# Patient Record
Sex: Female | Born: 1945
Health system: Southern US, Community
[De-identification: ages and names within clinical notes are randomized; demographics above are authoritative.]

## PROBLEM LIST (undated history)

## (undated) DIAGNOSIS — I10 Essential (primary) hypertension: Secondary | ICD-10-CM

## (undated) DIAGNOSIS — K2270 Barrett's esophagus without dysplasia: Secondary | ICD-10-CM

## (undated) DIAGNOSIS — K219 Gastro-esophageal reflux disease without esophagitis: Secondary | ICD-10-CM

## (undated) DIAGNOSIS — Z860101 Personal history of adenomatous and serrated colon polyps: Secondary | ICD-10-CM

## (undated) DIAGNOSIS — Z86010 Personal history of colonic polyps: Secondary | ICD-10-CM

## (undated) HISTORY — DX: Personal history of colonic polyps: Z86.010

## (undated) HISTORY — DX: Barrett's esophagus without dysplasia: K22.70

## (undated) HISTORY — PX: UPPER GASTROINTESTINAL ENDOSCOPY: SHX188

## (undated) HISTORY — PX: BACK SURGERY: SHX140

## (undated) HISTORY — DX: Personal history of adenomatous and serrated colon polyps: Z86.0101

## (undated) HISTORY — PX: COLONOSCOPY: SHX174

## (undated) HISTORY — PX: BUNIONECTOMY: SHX129

## (undated) HISTORY — DX: Essential (primary) hypertension: I10

---

## 1999-04-28 ENCOUNTER — Other Ambulatory Visit: Admission: RE | Admit: 1999-04-28 | Discharge: 1999-04-28 | Payer: Self-pay | Admitting: Obstetrics and Gynecology

## 1999-09-03 ENCOUNTER — Ambulatory Visit (HOSPITAL_COMMUNITY): Admission: RE | Admit: 1999-09-03 | Discharge: 1999-09-03 | Payer: Self-pay | Admitting: Gastroenterology

## 2000-05-10 ENCOUNTER — Other Ambulatory Visit: Admission: RE | Admit: 2000-05-10 | Discharge: 2000-05-10 | Payer: Self-pay | Admitting: Obstetrics and Gynecology

## 2000-10-25 ENCOUNTER — Encounter (INDEPENDENT_AMBULATORY_CARE_PROVIDER_SITE_OTHER): Payer: Self-pay

## 2000-10-25 ENCOUNTER — Ambulatory Visit (HOSPITAL_COMMUNITY): Admission: RE | Admit: 2000-10-25 | Discharge: 2000-10-25 | Payer: Self-pay | Admitting: Gastroenterology

## 2001-08-19 ENCOUNTER — Other Ambulatory Visit: Admission: RE | Admit: 2001-08-19 | Discharge: 2001-08-19 | Payer: Self-pay | Admitting: Obstetrics and Gynecology

## 2002-11-29 ENCOUNTER — Ambulatory Visit (HOSPITAL_COMMUNITY): Admission: RE | Admit: 2002-11-29 | Discharge: 2002-11-29 | Payer: Self-pay | Admitting: Gastroenterology

## 2002-11-29 ENCOUNTER — Encounter (INDEPENDENT_AMBULATORY_CARE_PROVIDER_SITE_OTHER): Payer: Self-pay

## 2002-12-08 ENCOUNTER — Other Ambulatory Visit: Admission: RE | Admit: 2002-12-08 | Discharge: 2002-12-08 | Payer: Self-pay | Admitting: Obstetrics and Gynecology

## 2004-09-10 ENCOUNTER — Other Ambulatory Visit: Admission: RE | Admit: 2004-09-10 | Discharge: 2004-09-10 | Payer: Self-pay | Admitting: Obstetrics and Gynecology

## 2004-09-25 ENCOUNTER — Ambulatory Visit (HOSPITAL_COMMUNITY): Admission: RE | Admit: 2004-09-25 | Discharge: 2004-09-25 | Payer: Self-pay | Admitting: Gastroenterology

## 2004-09-25 ENCOUNTER — Encounter (INDEPENDENT_AMBULATORY_CARE_PROVIDER_SITE_OTHER): Payer: Self-pay | Admitting: *Deleted

## 2005-10-06 ENCOUNTER — Other Ambulatory Visit: Admission: RE | Admit: 2005-10-06 | Discharge: 2005-10-06 | Payer: Self-pay | Admitting: Obstetrics and Gynecology

## 2008-03-01 ENCOUNTER — Other Ambulatory Visit: Admission: RE | Admit: 2008-03-01 | Discharge: 2008-03-01 | Payer: Self-pay | Admitting: Obstetrics and Gynecology

## 2008-06-20 ENCOUNTER — Observation Stay (HOSPITAL_COMMUNITY): Admission: AD | Admit: 2008-06-20 | Discharge: 2008-06-22 | Payer: Self-pay | Admitting: Specialist

## 2009-11-04 ENCOUNTER — Ambulatory Visit: Payer: Self-pay | Admitting: Internal Medicine

## 2009-11-04 ENCOUNTER — Inpatient Hospital Stay (HOSPITAL_COMMUNITY): Admission: EM | Admit: 2009-11-04 | Discharge: 2009-11-07 | Payer: Self-pay | Admitting: Emergency Medicine

## 2009-12-27 ENCOUNTER — Encounter: Admission: RE | Admit: 2009-12-27 | Discharge: 2009-12-27 | Payer: Self-pay | Admitting: Orthopedic Surgery

## 2010-07-27 ENCOUNTER — Encounter: Payer: Self-pay | Admitting: Gastroenterology

## 2010-09-23 LAB — BASIC METABOLIC PANEL
BUN: 12 mg/dL (ref 6–23)
CO2: 28 mEq/L (ref 19–32)
Calcium: 8 mg/dL — ABNORMAL LOW (ref 8.4–10.5)
Chloride: 105 mEq/L (ref 96–112)
Creatinine, Ser: 0.63 mg/dL (ref 0.4–1.2)
GFR calc Af Amer: >60 mL/min (ref >60)
GFR calc non Af Amer: >60 mL/min (ref >60)
Glucose, Bld: 113 mg/dL — ABNORMAL HIGH (ref 70–99)
Potassium: 3.6 mEq/L (ref 3.5–5.1)
Sodium: 138 mEq/L (ref 135–145)

## 2010-09-23 LAB — CBC
HCT: 30.7 % — ABNORMAL LOW (ref 36.0–46.0)
Hemoglobin: 10.3 g/dL — ABNORMAL LOW (ref 12.0–15.0)
MCHC: 33.6 g/dL (ref 30.0–36.0)
MCV: 88.2 fL (ref 78.0–100.0)
Platelets: 148 10*3/uL — ABNORMAL LOW (ref 150–400)
RBC: 3.48 MIL/uL — ABNORMAL LOW (ref 3.87–5.11)
RDW: 14.7 % (ref 11.5–15.5)
WBC: 5.8 10*3/uL (ref 4.0–10.5)

## 2010-11-18 NOTE — Op Note (Signed)
Sheila Rice, Sheila Rice               ACCOUNT NO.:  [id number]   MEDICAL RECORD NO.:  [id number]          PATIENT TYPE:  AMB   LOCATION:  DAY                          FACILITY:  [facility]   PHYSICIAN:  Jene Every, M.D.    DATE OF BIRTH:  03-Jul-1946   DATE OF PROCEDURE:  06/20/2008  DATE OF DISCHARGE:                               OPERATIVE REPORT   PREOPERATIVE DIAGNOSIS:  Spinal stenosis, spondylolisthesis at L4-5.   POSTOPERATIVE DIAGNOSIS:  Spinal stenosis, spondylolisthesis at L4-5.   PROCEDURE PERFORMED:  1. Central laminectomy of L4.  2. Lateral mass and facet fusion utilizing autologous allograft bone      graft.  3. Posterior instrumentation 4-5 facets.   SURGEON:  Jene Every, M.D.   ASSISTANT:  Alvy Beal, MD and Roma Schanz, P.A.   ESTIMATED BLOOD LOSS:  150 mL.   INDICATIONS FOR PROCEDURE:  A 65 year old with neurogenic claudication  secondary to spinal stenosis particularly left with neural foraminal  stenosis.  She had also associated back pain and disk degeneration at 5-  1.  She was indicated for decompression at 4-5, lateral mass possible  facet fusion with instrumentation.  She had fairly small disk space at 4-  5 with no instability of flexion extension.  Therefore felt that  interbody was not necessary.  Risks and benefits discussed, including  bleeding, infection, damage to neurovascular structures, CSF leakage or  epidural fibrosis, adjacent segment disease, need for augmentation in  the future.   TECHNIQUE:  With the patient in supine position after the induction of  adequate general anesthesia and 2 grams Kefzol she was placed prone on  the [employer] Wilson frame with slight flexion noted.  Lumbar region was  prepped and draped in the usual sterile fashion.  Two 18 gauge spinal  needles were utilized to localize the 4-5 interspace at the region of  the slit.  Incision was made from the spinous process of 4 to below 5.  Subcutaneous tissue was  dissected.  Electrocautery was utilized to  achieve hemostasis.  Dorsolumbar fascia identified by the skin incision.  Paraspinous muscle elevated from lamina of 4-5.  Confirmatory radiograph  obtained with Kocher on the spinous process of 4.  The spinous process  of 4 and 5 were removed with Leksell rongeurs, morselized and saved for  autologous bone graft.  We proceeded with the decompression and  performed a hemilaminotomy caudad edge of 2 and cephalad edge of 5 with  a 2 mm Kerrison, decompressing the central canal.  We then decompressed  the lateral recesses at the medial border of the pedicle utilizing a 2  mm Kerrison performing foraminotomies of L5 and L4 preserving the neural  elements at all times particularly over the stenosis and adherence was  notable on the left in the L4 foramen.  This was undercut, ligamentum  flavum removed and the soft tissue mobilized.  Following decompression  there was satisfactory mobility of the 4 and 5 roots and the hockey  stick probe was placed out the foramen of 4 and 5 and found to be widely  patent.  Bipolar electrocautery was utilized to achieve hemostasis as  was bone wax and thrombin soaked Gelfoam.   Next we skeletonized the facets at 4-5 bilaterally removing the  remaining cartilage and with a small dental bur and a small micro  curette performing good bleeding surfaces within the facets and  posteriorly as well as in the T-piece.  Next using the C-arm  augmentation we then placed two facet screws.  Bone graft was placed in  the facets first.  We reduced the flexion on the foramen.  We drilled a  drill hole into the facet bilaterally with slight divergence and using  guidance on the C-arm.  Both were in the vertebral body by probing.  We  used 4.5 screws bilaterally and inserted them with excellent compression  of both facets with expression of hematoma.  There was no fracture of  the pars and the pars was preserved.  This was seen in the  AP and  lateral plane.  We used a hockey stick probe out the foramen of 4 and 5  and felt the pedicle bilaterally at 5 and at 4 there was no breaching of  the pedicle noted.  No CSF leakage either.  The wound was copiously  irrigated.  We then augmented the lateral mass fusion with Actifuse bone  graft and her cancellous bone graft.  Prior to that we placed thrombin  soaked Gelfoam in the laminotomy defect to prevent migration of the bony  fragments.  The AP and lateral planes were found to be satisfactory.   Next McCullough retractors removed.  Paraspinous muscles inspected and  no evidence of active bleeding.  Dorsolumbar fascia reapproximated with  #1 Vicryl interrupted figure-of-eight sutures.  Subcutaneous tissue  reapproximated with 2-0 Vicryl simple sutures and skin reapproximated  with staples.  The wound was dressed sterilely.  She was placed supine  on the hospital bed and extubated without difficulty and transported to  recovery in satisfactory condition.  The patient tolerated the procedure  without complications.      Jene Every, M.D.  Electronically Signed     JB/MEDQ  D:  06/20/2008  T:  06/21/2008  Job:  [id number]

## 2010-11-21 NOTE — Op Note (Signed)
Sheila Rice, Sheila Rice               ACCOUNT NO.:  [id number]   MEDICAL RECORD NO.:  [id number]          PATIENT TYPE:  AMB   LOCATION:  ENDO                         FACILITY:  [facility]   PHYSICIAN:  John C. Madilyn Fireman, M.D.    DATE OF BIRTH:  1945-07-19   DATE OF PROCEDURE:  09/25/2004  DATE OF DISCHARGE:                                 OPERATIVE REPORT   PROCEDURE:  Colonoscopy.   INDICATION FOR PROCEDURE:  Lower abdominal pain in a patient undergoing  Barrett's surveillance today who has had no colon screening within the last  5 years.   DESCRIPTION OF PROCEDURE:  The patient was placed in the left lateral  decubitus position and placed on the pulse monitor with continuous low-flow  oxygen delivered by nasal cannula.  She was sedated with 25 mcg IV fentanyl  and 2 mg IV Versed in addition to the medicine given for the previous EGD.  The Olympus video colonoscope was inserted into the rectum and advanced to  the cecum, confirmed by transillumination at McBurney's point and  visualization at the ileocecal valve and appendiceal orifice.  The prep was  excellent.  The cecum, ascending, transverse, descending, and sigmoid colon  all appeared normal with no masses, polyps, diverticula, or other mucosal  abnormalities.  The rectum likewise appeared normal, and retroflexed view of  the anus revealed no obvious internal hemorrhoids.  The scope was then  withdrawn, and the patient returned to the recovery room in stable  condition.  She tolerated the procedure well, and there were no immediate  complications.   IMPRESSION:  Normal study.   PLAN:  Consider gynecologic work-up for lower abdominal pain and will also  consider course of antispasmodic.      JCH/MEDQ  D:  09/25/2004  T:  09/25/2004  Job:  [id number]   cc:   Caryn Bee L. Little, M.D.  [address]  [place]  [place] [address]  Fax: [phone]

## 2010-11-21 NOTE — Op Note (Signed)
   NAME:  Sheila Rice, Sheila Rice                         ACCOUNT NO.:  [id number]   MEDICAL RECORD NO.:  [id number]                   PATIENT TYPE:  AMB   LOCATION:  ENDO                                 FACILITY:  [facility]   PHYSICIAN:  John C. Madilyn Fireman, M.D.                 DATE OF BIRTH:  12/06/1945   DATE OF PROCEDURE:  11/29/2002  DATE OF DISCHARGE:                                 OPERATIVE REPORT   PROCEDURE:  Esophagogastroduodenoscopy with biopsy.   INDICATIONS:  Barrett's esophagus on EGD about two years ago.   DESCRIPTION OF PROCEDURE:  The patient was placed in the left lateral  decubitus position and placed on the pulse monitor with continuous low flow  oxygen delivered by nasal cannula.  She was sedated with 50 mcg IV fentanyl  and 6 mg IV Versed.  The Olympus video endoscope was advanced under direct  vision into the oropharynx, esophagus and stomach.  The esophagus was  straight and of normal caliber, the squamocolumnar line somewhat irregular  at 30-29 cm from the incisors. The lower esophageal sphincter appeared to be  at approximately 33 cm indicative of 3 cm segment of Barrett's esophagus.  There was a 3 cm hiatal hernia distal to the lower esophageal sphincter with  the apparent hiatus evident at 36 cm.  There was free reflux noted during  the procedure but no visible esophagitis and no ring or stricture.  There  was no visible suspicion of mucosal neoplasm.  The stomach was entered and a  small amount of liquid secretions were suctioned from the fundus.  Retroflexed view of the cardia confirmed the hiatal hernia and was otherwise  unremarkable.  The fundus, body, antrum, and pylorus all appeared normal.  The duodenum was entered and both bulb and second portion were well  inspected and appeared to be within normal limits.  The scope was then  withdrawn back into the esophagus and biopsies were taken from the Barrett's  segment.  The scope was then withdrawn and the patient  returned to the  recovery room in stable condition.  She tolerated the procedure well and  there were no immediate complications.   IMPRESSION:  Short segment Barrett's esophagus with hiatal hernia.   PLAN:  Will await biopsy results to determine the interval for the next  endoscopy.                                               John C. Madilyn Fireman, M.D.    JCH/MEDQ  D:  11/29/2002  T:  11/29/2002  Job:  [id number]   cc:   Caryn Bee L. Little, M.D.  [address]  [place]  [place] [address]  Fax: [phone]

## 2010-11-21 NOTE — Procedures (Signed)
[facility]  Patient:    Sheila Rice, Sheila Rice                        MRN: [id number]. Date: 10/25/00 Adm. Date:  [id number] Attending:  Louie Bun                           Procedure Report  PROCEDURE:  Esophagogastroduodenoscopy with biopsy.  INDICATIONS FOR PROCEDURE:  A history of severe esophagitis, unable to rule out Barretts esophagus by previous EGD.  DESCRIPTION OF PROCEDURE:  The patient was placed in the left lateral decubitus position and placed on the pulse monitor with continuous low-flow oxygen delivered by nasal cannula.  She was sedated with 50 mg IV Demerol and 4 mg IV Versed.  The Olympus video endoscope was advanced under direct vision into the oropharynx and esophagus.  The esophagus was slightly tortuous, but of normal caliber; with the squamocolumnar line somewhat irregular, ranging between 29 and 27 cm from the incisors.  There was a broad hiatal hernia and a patchulous gastroesophageal junction.  The true LES appeared to be at about 31 cm, indicating a Barretts segment ranging from 2-5 cm.  There were no erosions, exudate or ulcers; and no visible suspicion of neoplasm.  There was a 5 cm hiatal hernia distal to, what appeared to be, the true LES.  No inflammatory changes were noted within the hernia sac.  The stomach was entered and a small amount of liquid secretions were suctioned from the fundus.  Retroflexed view of the cardia confirmed the large hiatal hernia, and was otherwise unremarkable.  The fundus body, antrum and pylorus all appeared normal.  The duodenum was entered and both the bulb and second portion were well inspected; appeared to be within normal limits.  The scope was withdrawn back into the distal esophagus and five biopsies of the Barretts segment were obtained.  The scope was then withdrawn, and the patient returned to the recovery room in stable condition.  She tolerated the procedure well and there  were no immediate complications.  IMPRESSION: 1. Short segment of Barretts esophagus. 2. Proximal and anteromedial hiatal hernia.  PLAN:  Await histology, and we will probably repeat EGD in two to three years. DD:  10/25/00 TD:  10/25/00 Job: [id number]

## 2010-11-21 NOTE — Op Note (Signed)
Sheila Rice, Sheila Rice               ACCOUNT NO.:  [id number]   MEDICAL RECORD NO.:  [id number]          PATIENT TYPE:  AMB   LOCATION:  ENDO                         FACILITY:  [facility]   PHYSICIAN:  John C. Madilyn Fireman, M.D.    DATE OF BIRTH:  [birth year]   DATE OF PROCEDURE:  09/25/2004  DATE OF DISCHARGE:                                 OPERATIVE REPORT   INDICATION FOR PROCEDURE:  History of Barrett's esophagus, due for  surveillance.   PROCEDURE:  The patient was placed in the left lateral decubitus position  and placed on pulse monitor with continuous low-flow oxygen delivered by  nasal cannula. She was sedated with 75 mcg IV fentanyl and 6 mg IV Versed.  Olympus video endoscope was advanced under direct vision into the oropharynx  and esophagus.  The esophagus was straight and of normal caliber, with the  squamocolumnar line at 31 cm. The GE junction appeared to be at 34 cm above  a 3-cm hiatal hernia. Biopsies were taken of the Barrett's segment from 31-  34 cm. There were no ulcers or erosions seen within the Barrett's segment or  the hernia sac. Stomach was entered. A small amount of liquid secretions  were suctioned from the fundus. Retroflexed view of the cardia was  unremarkable. Retroflexed view of cardia confirmed a large hiatal hernia and  was otherwise unremarkable. The fundus, body, antrum, and pylorus all  appeared normal. The duodenum was entered and both bulb and second portion  were well inspected and appeared be within normal limits. Scope was then  withdrawn and the patient prepared for colonoscopy. She tolerated the  procedure well. There were no immediate complications.   IMPRESSION:  1.  Large hiatal hernia.  2.  A 3-cm Barrett's esophagus segment.   PLAN:  Await histology and will proceed with colonoscopy as scheduled.      JCH/MEDQ  D:  09/25/2004  T:  09/25/2004  Job:  [id number]   cc:   Caryn Bee L. Little, M.D.  [address]  [place]  [place]  [address]  Fax: [phone]

## 2010-11-21 NOTE — Discharge Summary (Signed)
Sheila Rice               ACCOUNT NO.:  [id number]   MEDICAL RECORD NO.:  [id number]          PATIENT TYPE:  INP   LOCATION:  1535                         FACILITY:  [facility]   PHYSICIAN:  Jene Every, M.D.    DATE OF BIRTH:  July 20, 1945   DATE OF ADMISSION:  06/20/2008  DATE OF DISCHARGE:  06/22/2008                               DISCHARGE SUMMARY   ADMISSION DIAGNOSES:  1. Spinal stenosis, facet arthropathy at L4-5.  2. Gastroesophageal reflux disease.  3. Osteoporosis.   DISCHARGE DIAGNOSES:  1. Spinal stenosis, facet arthropathy at L4-5.  2. Gastroesophageal reflux disease.  3. Osteoporosis.  4. Status post decompression lateral mass fusion with facet fixation.   HISTORY:  [name] is a pleasant 65 year old female with a history  of back and lower extremity pain.  Studies reveal fairly severe stenosis  as well as facet arthropathy.  She has undergone conservative treatment  and failed.  It is felt at this point she would benefit from a  decompression at 4-5 as well as fixation of facet.  The risks and  benefits of this were discussed with the patient and she does elect to  proceed.   PROCEDURE:  The patient was taken to the OR on December 16 and underwent  a total decompression at L4-5 with lateral mass fusion with facet  fixation.   SURGEON:  Jene Every, M.D.   ASSISTANT:  Alvy Beal, M.D.  Roma Schanz, P.A.-C.   ANESTHESIA:  General.   COMPLICATIONS:  None.   LABORATORY DATA:  Preoperative CBC shows white cell count 6.8,  hemoglobin 12.0, hematocrit 36.4.  This was repeated postoperatively  with white cell count within normal range.  Hemoglobin did drop to a  level of 9.9, hematocrit 29.2.  Coagulation studies were probably within  normal range preop and postop.  Chemistries remained stable.  Routine  liver function tests showed slightly elevated AST of 52, ALT of 40.  Preoperative urinalysis showed trace hemoglobin, only 0-2 RBCs seen  per  high-powered field.  Blood type is A positive.  A preoperative chest x-  ray showed no active disease; hiatal hernia was noted.  A preoperative  EKG showed normal sinus rhythm, possible left atrial enlargement.   CONSULTATIONS:  Physical Therapy and Occupational Therapy.   HOSPITAL COURSE:  The patient was taken to the OR and underwent the  above stated procedure.  She was then transferred to the PACU and then  to the orthopedic floor for postoperative care.  Postoperatively, the  patient did well.  She did have some low back pain, but decreased lower  extremity pain.  She was complaining of some nausea secondary to  medications.  She denied any headaches.  The dressing remained clean and  dry.  We did adjust her medications accordingly to prevent nausea.  Discharge planning was initiated.  The patient did fairly well with  therapy.  On postoperative day #2, it was felt the patient was stable to  be discharged home.  Pain was much improved.  Nausea had resolved.  She  had been out of bed to  the bathroom with minimal difficulty.  Vital  signs were stable.  Incision was clean and dry.   DISPOSITION:  The patient discharged home.  Will continue Home Health  Therapy needs.   DISCHARGE FOLLOWUP:  She is to follow up with Dr. Shelle Iron in approximately  10-14 days.  She is to change her dressing daily.  Keep this area clean  and dry.   DISCHARGE ACTIVITIES:  She is to walk as tolerated.  No dipping,  twisting or lifting.   DISCHARGE MEDICATIONS:  Include home medications as well as:  1. Vitamin C 500 mg daily.  2. Norco p.r.n. pain.  3. Robaxin p.r.n. for spasms.  4. Stool softener over the counter.   DISCHARGE DIET:  As tolerated.   CONDITION ON DISCHARGE:  Stable.   FINAL DIAGNOSIS:  Doing well status post decompression at L4-5 with  facet fixation.      Roma Schanz, P.A.      Jene Every, M.D.  Electronically Signed    CS/MEDQ  D:  07/18/2008  T:   07/18/2008  Job:  [id number]

## 2011-04-10 LAB — CBC
HCT: 29.2 % — ABNORMAL LOW (ref 36.0–46.0)
HCT: 36.4 % (ref 36.0–46.0)
Hemoglobin: 12 g/dL (ref 12.0–15.0)
Hemoglobin: 9.9 g/dL — ABNORMAL LOW (ref 12.0–15.0)
MCHC: 33 g/dL (ref 30.0–36.0)
MCHC: 33.7 g/dL (ref 30.0–36.0)
MCV: 89.7 fL (ref 78.0–100.0)
MCV: 90.6 fL (ref 78.0–100.0)
Platelets: 143 10*3/uL — ABNORMAL LOW (ref 150–400)
Platelets: 177 10*3/uL (ref 150–400)
RBC: 3.22 MIL/uL — ABNORMAL LOW (ref 3.87–5.11)
RBC: 4.06 MIL/uL (ref 3.87–5.11)
RDW: 13.9 % (ref 11.5–15.5)
RDW: 14.3 % (ref 11.5–15.5)
WBC: 6.1 10*3/uL (ref 4.0–10.5)
WBC: 6.8 10*3/uL (ref 4.0–10.5)

## 2011-04-10 LAB — URINALYSIS, ROUTINE W REFLEX MICROSCOPIC
Bilirubin Urine: NEGATIVE
Glucose, UA: NEGATIVE mg/dL
Ketones, ur: NEGATIVE mg/dL
Leukocytes, UA: NEGATIVE
Nitrite: NEGATIVE
Protein, ur: NEGATIVE mg/dL
Specific Gravity, Urine: 1.017 (ref 1.005–1.030)
Urobilinogen, UA: 0.2 mg/dL (ref 0.0–1.0)
pH: 6 (ref 5.0–8.0)

## 2011-04-10 LAB — DIFFERENTIAL
Basophils Absolute: 0 10*3/uL (ref 0.0–0.1)
Basophils Relative: 0 % (ref 0–1)
Eosinophils Absolute: 0.3 10*3/uL (ref 0.0–0.7)
Eosinophils Relative: 5 % (ref 0–5)
Lymphocytes Relative: 17 % (ref 12–46)
Lymphs Abs: 1.1 10*3/uL (ref 0.7–4.0)
Monocytes Absolute: 0.5 10*3/uL (ref 0.1–1.0)
Monocytes Relative: 8 % (ref 3–12)
Neutro Abs: 4.8 10*3/uL (ref 1.7–7.7)
Neutrophils Relative %: 70 % (ref 43–77)

## 2011-04-10 LAB — BASIC METABOLIC PANEL
BUN: 7 mg/dL (ref 6–23)
CO2: 29 mEq/L (ref 19–32)
Calcium: 7.8 mg/dL — ABNORMAL LOW (ref 8.4–10.5)
Chloride: 100 mEq/L (ref 96–112)
Creatinine, Ser: 0.59 mg/dL (ref 0.4–1.2)
GFR calc Af Amer: >60 mL/min (ref >60)
GFR calc non Af Amer: >60 mL/min (ref >60)
Glucose, Bld: 133 mg/dL — ABNORMAL HIGH (ref 70–99)
Potassium: 4 mEq/L (ref 3.5–5.1)
Sodium: 135 mEq/L (ref 135–145)

## 2011-04-10 LAB — COMPREHENSIVE METABOLIC PANEL
ALT: 40 U/L — ABNORMAL HIGH (ref 0–35)
AST: 52 U/L — ABNORMAL HIGH (ref 0–37)
Albumin: 3.8 g/dL (ref 3.5–5.2)
Alkaline Phosphatase: 61 U/L (ref 39–117)
BUN: 18 mg/dL (ref 6–23)
CO2: 29 mEq/L (ref 19–32)
Calcium: 9.4 mg/dL (ref 8.4–10.5)
Chloride: 105 mEq/L (ref 96–112)
Creatinine, Ser: 0.67 mg/dL (ref 0.4–1.2)
GFR calc Af Amer: >60 mL/min (ref >60)
GFR calc non Af Amer: >60 mL/min (ref >60)
Glucose, Bld: 114 mg/dL — ABNORMAL HIGH (ref 70–99)
Potassium: 5 mEq/L (ref 3.5–5.1)
Sodium: 142 mEq/L (ref 135–145)
Total Bilirubin: 0.6 mg/dL (ref 0.3–1.2)
Total Protein: 6.5 g/dL (ref 6.0–8.3)

## 2011-04-10 LAB — PROTIME-INR
INR: 0.9 (ref 0.00–1.49)
Prothrombin Time: 12.5 seconds (ref 11.6–15.2)

## 2011-04-10 LAB — APTT: aPTT: 24 seconds (ref 24–37)

## 2011-04-10 LAB — TYPE AND SCREEN
ABO/RH(D): A POS
Antibody Screen: NEGATIVE

## 2011-04-10 LAB — ABO/RH: ABO/RH(D): A POS

## 2011-04-10 LAB — URINE MICROSCOPIC-ADD ON

## 2011-12-15 DIAGNOSIS — H40029 Open angle with borderline findings, high risk, unspecified eye: Secondary | ICD-10-CM | POA: Diagnosis not present

## 2012-01-05 DIAGNOSIS — M79609 Pain in unspecified limb: Secondary | ICD-10-CM | POA: Diagnosis not present

## 2012-01-15 DIAGNOSIS — D509 Iron deficiency anemia, unspecified: Secondary | ICD-10-CM | POA: Diagnosis not present

## 2012-02-18 DIAGNOSIS — H40019 Open angle with borderline findings, low risk, unspecified eye: Secondary | ICD-10-CM | POA: Diagnosis not present

## 2012-04-05 DIAGNOSIS — K219 Gastro-esophageal reflux disease without esophagitis: Secondary | ICD-10-CM | POA: Diagnosis not present

## 2012-04-05 DIAGNOSIS — K2270 Barrett's esophagus without dysplasia: Secondary | ICD-10-CM | POA: Diagnosis not present

## 2012-04-05 DIAGNOSIS — Z0000 Encounter for general adult medical examination without abnormal findings: Secondary | ICD-10-CM | POA: Diagnosis not present

## 2012-04-05 DIAGNOSIS — R5383 Other fatigue: Secondary | ICD-10-CM | POA: Diagnosis not present

## 2012-04-05 DIAGNOSIS — Z136 Encounter for screening for cardiovascular disorders: Secondary | ICD-10-CM | POA: Diagnosis not present

## 2012-04-05 DIAGNOSIS — R5381 Other malaise: Secondary | ICD-10-CM | POA: Diagnosis not present

## 2012-04-05 DIAGNOSIS — Z23 Encounter for immunization: Secondary | ICD-10-CM | POA: Diagnosis not present

## 2012-04-05 DIAGNOSIS — M810 Age-related osteoporosis without current pathological fracture: Secondary | ICD-10-CM | POA: Diagnosis not present

## 2012-04-05 DIAGNOSIS — Z13220 Encounter for screening for lipoid disorders: Secondary | ICD-10-CM | POA: Diagnosis not present

## 2012-04-08 DIAGNOSIS — E7800 Pure hypercholesterolemia, unspecified: Secondary | ICD-10-CM | POA: Diagnosis not present

## 2012-04-08 DIAGNOSIS — D649 Anemia, unspecified: Secondary | ICD-10-CM | POA: Diagnosis not present

## 2012-04-08 DIAGNOSIS — E559 Vitamin D deficiency, unspecified: Secondary | ICD-10-CM | POA: Diagnosis not present

## 2012-04-15 DIAGNOSIS — D649 Anemia, unspecified: Secondary | ICD-10-CM | POA: Diagnosis not present

## 2012-05-05 ENCOUNTER — Other Ambulatory Visit: Payer: Medicare Other

## 2012-05-05 ENCOUNTER — Other Ambulatory Visit: Payer: Self-pay | Admitting: Internal Medicine

## 2012-05-05 DIAGNOSIS — Z0000 Encounter for general adult medical examination without abnormal findings: Secondary | ICD-10-CM

## 2012-05-06 LAB — HEPATITIS C ANTIBODY: HCV Ab: NEGATIVE

## 2012-05-06 LAB — HIV ANTIBODY (ROUTINE TESTING W REFLEX): HIV: NONREACTIVE

## 2012-05-06 LAB — RPR

## 2012-05-06 LAB — HEPATITIS A ANTIBODY, TOTAL: Hep A Total Ab: POSITIVE — AB

## 2012-05-06 LAB — HEPATITIS B SURFACE ANTIGEN: Hepatitis B Surface Ag: NEGATIVE

## 2012-05-06 LAB — HEPATITIS B SURFACE ANTIBODY,QUALITATIVE: Hep B S Ab: NEGATIVE

## 2012-05-06 NOTE — Progress Notes (Signed)
Mrs. Sheila Rice is being considered as a donor for her husband for fecal microbiota therapy. She denies any antibiotic use in last 3 months, no GI illnesses in last 3 months, no hx of GI disorders. No international travel in last 3 months. Will start serology and stool studies to evaluate if she would be an appropriate donor for her husband.  We have discussed the FMT process in addition informed consent due to experimental process. Only change in the informed consent is that FMT will be delivered via enema.

## 2012-05-17 ENCOUNTER — Other Ambulatory Visit (INDEPENDENT_AMBULATORY_CARE_PROVIDER_SITE_OTHER): Payer: Medicare Other

## 2012-05-17 DIAGNOSIS — Z5289 Donor of other specified organs or tissues: Secondary | ICD-10-CM

## 2012-05-17 DIAGNOSIS — Z0000 Encounter for general adult medical examination without abnormal findings: Secondary | ICD-10-CM | POA: Diagnosis not present

## 2012-05-18 LAB — HEPATITIS A ANTIBODY, IGM: Hep A IgM: NEGATIVE

## 2012-05-21 LAB — CLOSTRIDIUM DIFFICILE EIA: CDIFTX: NEGATIVE

## 2012-05-21 LAB — STOOL CULTURE

## 2012-06-07 DIAGNOSIS — M810 Age-related osteoporosis without current pathological fracture: Secondary | ICD-10-CM | POA: Diagnosis not present

## 2012-06-07 DIAGNOSIS — Z1231 Encounter for screening mammogram for malignant neoplasm of breast: Secondary | ICD-10-CM | POA: Diagnosis not present

## 2012-06-14 DIAGNOSIS — R928 Other abnormal and inconclusive findings on diagnostic imaging of breast: Secondary | ICD-10-CM | POA: Diagnosis not present

## 2012-06-16 LAB — CLOSTRIDIUM DIFFICILE TOXIN

## 2012-11-25 DIAGNOSIS — D509 Iron deficiency anemia, unspecified: Secondary | ICD-10-CM | POA: Diagnosis not present

## 2012-12-07 DIAGNOSIS — K2270 Barrett's esophagus without dysplasia: Secondary | ICD-10-CM | POA: Diagnosis not present

## 2012-12-07 DIAGNOSIS — K219 Gastro-esophageal reflux disease without esophagitis: Secondary | ICD-10-CM | POA: Diagnosis not present

## 2013-01-17 DIAGNOSIS — H2510 Age-related nuclear cataract, unspecified eye: Secondary | ICD-10-CM | POA: Diagnosis not present

## 2013-02-21 DIAGNOSIS — R928 Other abnormal and inconclusive findings on diagnostic imaging of breast: Secondary | ICD-10-CM | POA: Diagnosis not present

## 2013-02-21 DIAGNOSIS — R920 Mammographic microcalcification found on diagnostic imaging of breast: Secondary | ICD-10-CM | POA: Diagnosis not present

## 2013-04-06 DIAGNOSIS — Z1331 Encounter for screening for depression: Secondary | ICD-10-CM | POA: Diagnosis not present

## 2013-04-06 DIAGNOSIS — Z23 Encounter for immunization: Secondary | ICD-10-CM | POA: Diagnosis not present

## 2013-04-06 DIAGNOSIS — E7800 Pure hypercholesterolemia, unspecified: Secondary | ICD-10-CM | POA: Diagnosis not present

## 2013-04-06 DIAGNOSIS — Z79899 Other long term (current) drug therapy: Secondary | ICD-10-CM | POA: Diagnosis not present

## 2013-04-06 DIAGNOSIS — M810 Age-related osteoporosis without current pathological fracture: Secondary | ICD-10-CM | POA: Diagnosis not present

## 2013-04-06 DIAGNOSIS — E559 Vitamin D deficiency, unspecified: Secondary | ICD-10-CM | POA: Diagnosis not present

## 2013-04-06 DIAGNOSIS — Z0000 Encounter for general adult medical examination without abnormal findings: Secondary | ICD-10-CM | POA: Diagnosis not present

## 2013-04-06 DIAGNOSIS — D649 Anemia, unspecified: Secondary | ICD-10-CM | POA: Diagnosis not present

## 2013-04-10 DIAGNOSIS — N63 Unspecified lump in unspecified breast: Secondary | ICD-10-CM | POA: Diagnosis not present

## 2013-11-23 DIAGNOSIS — D649 Anemia, unspecified: Secondary | ICD-10-CM | POA: Diagnosis not present

## 2013-11-23 DIAGNOSIS — I10 Essential (primary) hypertension: Secondary | ICD-10-CM | POA: Diagnosis not present

## 2013-11-23 DIAGNOSIS — E559 Vitamin D deficiency, unspecified: Secondary | ICD-10-CM | POA: Diagnosis not present

## 2013-11-23 DIAGNOSIS — R7301 Impaired fasting glucose: Secondary | ICD-10-CM | POA: Diagnosis not present

## 2013-11-23 DIAGNOSIS — Z23 Encounter for immunization: Secondary | ICD-10-CM | POA: Diagnosis not present

## 2013-11-23 DIAGNOSIS — E7800 Pure hypercholesterolemia, unspecified: Secondary | ICD-10-CM | POA: Diagnosis not present

## 2013-11-23 DIAGNOSIS — Z862 Personal history of diseases of the blood and blood-forming organs and certain disorders involving the immune mechanism: Secondary | ICD-10-CM | POA: Diagnosis not present

## 2013-11-23 DIAGNOSIS — Z8639 Personal history of other endocrine, nutritional and metabolic disease: Secondary | ICD-10-CM | POA: Diagnosis not present

## 2013-12-19 DIAGNOSIS — E119 Type 2 diabetes mellitus without complications: Secondary | ICD-10-CM | POA: Diagnosis not present

## 2013-12-19 DIAGNOSIS — E7800 Pure hypercholesterolemia, unspecified: Secondary | ICD-10-CM | POA: Diagnosis not present

## 2013-12-19 DIAGNOSIS — D649 Anemia, unspecified: Secondary | ICD-10-CM | POA: Diagnosis not present

## 2013-12-21 ENCOUNTER — Other Ambulatory Visit: Payer: Self-pay | Admitting: Gastroenterology

## 2013-12-21 DIAGNOSIS — K449 Diaphragmatic hernia without obstruction or gangrene: Secondary | ICD-10-CM | POA: Diagnosis not present

## 2013-12-21 DIAGNOSIS — K2270 Barrett's esophagus without dysplasia: Secondary | ICD-10-CM | POA: Diagnosis not present

## 2013-12-21 DIAGNOSIS — K209 Esophagitis, unspecified without bleeding: Secondary | ICD-10-CM | POA: Diagnosis not present

## 2014-01-22 DIAGNOSIS — H40019 Open angle with borderline findings, low risk, unspecified eye: Secondary | ICD-10-CM | POA: Diagnosis not present

## 2014-03-14 DIAGNOSIS — H40019 Open angle with borderline findings, low risk, unspecified eye: Secondary | ICD-10-CM | POA: Diagnosis not present

## 2014-04-20 ENCOUNTER — Other Ambulatory Visit: Payer: Self-pay

## 2014-05-17 DIAGNOSIS — R0789 Other chest pain: Secondary | ICD-10-CM | POA: Diagnosis not present

## 2014-05-17 DIAGNOSIS — Z23 Encounter for immunization: Secondary | ICD-10-CM | POA: Diagnosis not present

## 2014-05-17 DIAGNOSIS — H6120 Impacted cerumen, unspecified ear: Secondary | ICD-10-CM | POA: Diagnosis not present

## 2014-05-22 DIAGNOSIS — H6120 Impacted cerumen, unspecified ear: Secondary | ICD-10-CM | POA: Diagnosis not present

## 2014-07-26 DIAGNOSIS — M810 Age-related osteoporosis without current pathological fracture: Secondary | ICD-10-CM | POA: Diagnosis not present

## 2014-07-26 DIAGNOSIS — Z1231 Encounter for screening mammogram for malignant neoplasm of breast: Secondary | ICD-10-CM | POA: Diagnosis not present

## 2014-09-04 DIAGNOSIS — E119 Type 2 diabetes mellitus without complications: Secondary | ICD-10-CM | POA: Diagnosis not present

## 2014-09-04 DIAGNOSIS — Z0000 Encounter for general adult medical examination without abnormal findings: Secondary | ICD-10-CM | POA: Diagnosis not present

## 2014-09-04 DIAGNOSIS — R748 Abnormal levels of other serum enzymes: Secondary | ICD-10-CM | POA: Diagnosis not present

## 2014-09-04 DIAGNOSIS — E559 Vitamin D deficiency, unspecified: Secondary | ICD-10-CM | POA: Diagnosis not present

## 2014-09-04 DIAGNOSIS — K219 Gastro-esophageal reflux disease without esophagitis: Secondary | ICD-10-CM | POA: Diagnosis not present

## 2014-09-04 DIAGNOSIS — M810 Age-related osteoporosis without current pathological fracture: Secondary | ICD-10-CM | POA: Diagnosis not present

## 2014-09-04 DIAGNOSIS — I10 Essential (primary) hypertension: Secondary | ICD-10-CM | POA: Diagnosis not present

## 2014-09-04 DIAGNOSIS — D649 Anemia, unspecified: Secondary | ICD-10-CM | POA: Diagnosis not present

## 2014-09-04 DIAGNOSIS — Z8639 Personal history of other endocrine, nutritional and metabolic disease: Secondary | ICD-10-CM | POA: Diagnosis not present

## 2014-09-04 DIAGNOSIS — E780 Pure hypercholesterolemia: Secondary | ICD-10-CM | POA: Diagnosis not present

## 2014-10-30 DIAGNOSIS — Z1211 Encounter for screening for malignant neoplasm of colon: Secondary | ICD-10-CM | POA: Diagnosis not present

## 2014-10-30 DIAGNOSIS — K2270 Barrett's esophagus without dysplasia: Secondary | ICD-10-CM | POA: Diagnosis not present

## 2014-10-30 DIAGNOSIS — K219 Gastro-esophageal reflux disease without esophagitis: Secondary | ICD-10-CM | POA: Diagnosis not present

## 2014-10-30 DIAGNOSIS — R748 Abnormal levels of other serum enzymes: Secondary | ICD-10-CM | POA: Diagnosis not present

## 2014-11-01 ENCOUNTER — Encounter: Payer: Self-pay | Admitting: Podiatry

## 2014-11-01 ENCOUNTER — Ambulatory Visit (INDEPENDENT_AMBULATORY_CARE_PROVIDER_SITE_OTHER): Payer: Medicare Other | Admitting: Podiatry

## 2014-11-01 VITALS — BP 164/76 | HR 58 | Resp 12

## 2014-11-01 DIAGNOSIS — B351 Tinea unguium: Secondary | ICD-10-CM | POA: Diagnosis not present

## 2014-11-01 MED ORDER — TERBINAFINE HCL 250 MG PO TABS: ORAL_TABLET | ORAL | Status: DC

## 2014-11-01 NOTE — Progress Notes (Signed)
Subjective:     Patient ID: Sheila Rice, female   DOB: Oct 22, 1945, 69 y.o.   MRN: [id number]  HPI patient presents stating I have several nails that are thick and brittle and I cannot cut them and I do not like the appearance of them. I like to know about treatments we can do to get this better   Review of Systems  All other systems reviewed and are negative.      Objective:   Physical Exam  Constitutional: She is oriented to person, place, and time.  Cardiovascular: Intact distal pulses.   Musculoskeletal: Normal range of motion.  Neurological: She is oriented to person, place, and time.  Skin: Skin is warm.  Nursing note and vitals reviewed.  neurovascular status intact with muscle strength adequate range of motion subtalar midtarsal joint within normal limits. Patient's noted to have good digital perfusion and is well oriented 3 and I noted there to be thickness of the hallux nail bilateral and 3 other nails on each foot with discoloration noted of the localized nature. Patient does have family history of condition     Assessment:     Mycotic nail infection bilateral    Plan:     H&P and condition discussed explained to patient. At this point I do think that a accommodation of laser pulse Lamisil treatment and topical would be in her best interest. I spent a great of time educating her on condition and treatments and patient wants this treatment regimen understanding risk. Scheduled for laser of approximate 7 nails along with oral Lamisil pills 1 week per month for 4 months along with topical formulas 3

## 2014-11-01 NOTE — Progress Notes (Signed)
   Subjective:    Patient ID: Sheila Rice, female    DOB: Apr 13, 1946, 69 y.o.   MRN: [id number]  HPI PT STATED B/L TOENAILS HAVE DISCOLORATION AND THICK FOR 1 YEAR. THE TOENAILS ARE GETTING WORSE AND DISCOLORATION TO THE OTHER TOENAILS/THICKER. TRIED NO TREATMENT.  PT WANTS TO GET THE LASER TREATMENT.  Review of Systems  Skin: Positive for color change.       Objective:   Physical Exam        Assessment & Plan:

## 2014-11-13 DIAGNOSIS — I83813 Varicose veins of bilateral lower extremities with pain: Secondary | ICD-10-CM | POA: Diagnosis not present

## 2014-11-13 DIAGNOSIS — I87393 Chronic venous hypertension (idiopathic) with other complications of bilateral lower extremity: Secondary | ICD-10-CM | POA: Diagnosis not present

## 2014-11-15 ENCOUNTER — Ambulatory Visit: Payer: Medicare Other | Admitting: Podiatry

## 2014-11-15 DIAGNOSIS — I87393 Chronic venous hypertension (idiopathic) with other complications of bilateral lower extremity: Secondary | ICD-10-CM | POA: Diagnosis not present

## 2014-11-15 DIAGNOSIS — I83813 Varicose veins of bilateral lower extremities with pain: Secondary | ICD-10-CM | POA: Diagnosis not present

## 2014-11-20 ENCOUNTER — Ambulatory Visit: Payer: Medicare Other | Admitting: Podiatry

## 2014-11-27 ENCOUNTER — Encounter: Payer: Self-pay | Admitting: Podiatry

## 2014-11-27 ENCOUNTER — Ambulatory Visit (INDEPENDENT_AMBULATORY_CARE_PROVIDER_SITE_OTHER): Payer: Medicare Other | Admitting: Podiatry

## 2014-11-27 DIAGNOSIS — B351 Tinea unguium: Secondary | ICD-10-CM

## 2014-11-28 NOTE — Progress Notes (Signed)
Subjective:     Patient ID: Sheila Rice, female   DOB: 09-Nov-1945, 69 y.o.   MRN: [id number]  HPI patient states I'm ready to have this procedure done   Review of Systems     Objective:   Physical Exam Mycotic nail infection 7    Assessment:     Mycotic infection    Plan:     Laser approximately 2000 pulses were administered with the left big toenail being administered to the highest degree

## 2014-12-04 DIAGNOSIS — I87393 Chronic venous hypertension (idiopathic) with other complications of bilateral lower extremity: Secondary | ICD-10-CM | POA: Diagnosis not present

## 2014-12-04 DIAGNOSIS — I83813 Varicose veins of bilateral lower extremities with pain: Secondary | ICD-10-CM | POA: Diagnosis not present

## 2014-12-10 DIAGNOSIS — H2513 Age-related nuclear cataract, bilateral: Secondary | ICD-10-CM | POA: Diagnosis not present

## 2014-12-31 ENCOUNTER — Other Ambulatory Visit: Payer: Self-pay

## 2015-01-08 ENCOUNTER — Ambulatory Visit: Payer: Medicare Other | Admitting: Podiatry

## 2015-01-14 ENCOUNTER — Ambulatory Visit: Payer: Medicare Other | Admitting: Podiatry

## 2015-02-04 ENCOUNTER — Ambulatory Visit: Payer: Medicare Other | Admitting: Podiatry

## 2015-02-08 ENCOUNTER — Other Ambulatory Visit: Payer: Self-pay | Admitting: Gastroenterology

## 2015-02-08 DIAGNOSIS — D123 Benign neoplasm of transverse colon: Secondary | ICD-10-CM | POA: Diagnosis not present

## 2015-02-08 DIAGNOSIS — D126 Benign neoplasm of colon, unspecified: Secondary | ICD-10-CM | POA: Diagnosis not present

## 2015-02-08 DIAGNOSIS — Z1211 Encounter for screening for malignant neoplasm of colon: Secondary | ICD-10-CM | POA: Diagnosis not present

## 2015-02-14 DIAGNOSIS — I83813 Varicose veins of bilateral lower extremities with pain: Secondary | ICD-10-CM | POA: Diagnosis not present

## 2015-02-14 DIAGNOSIS — G2581 Restless legs syndrome: Secondary | ICD-10-CM | POA: Diagnosis not present

## 2015-02-15 ENCOUNTER — Ambulatory Visit (INDEPENDENT_AMBULATORY_CARE_PROVIDER_SITE_OTHER): Payer: Medicare Other | Admitting: Podiatry

## 2015-02-15 DIAGNOSIS — B351 Tinea unguium: Secondary | ICD-10-CM

## 2015-02-15 MED ORDER — TERBINAFINE HCL 250 MG PO TABS: ORAL_TABLET | ORAL | Status: DC

## 2015-02-15 NOTE — Progress Notes (Signed)
Subjective:     Patient ID: Sheila Rice, female   DOB: 04-Dec-1945, 69 y.o.   MRN: [id number]  HPI patient presents stating I'm doing better with my nails   Review of Systems     Objective:   Physical Exam Neurovascular status intact with yellowness of the hallux nails that seems to be gradually improving    Assessment:     Mycotic nail infection bilateral    Plan:     Laser administered approximate 1500 pulses which was tolerated well reappoint to recheck in 4 months

## 2015-02-20 DIAGNOSIS — I83811 Varicose veins of right lower extremities with pain: Secondary | ICD-10-CM | POA: Diagnosis not present

## 2015-02-20 DIAGNOSIS — I87391 Chronic venous hypertension (idiopathic) with other complications of right lower extremity: Secondary | ICD-10-CM | POA: Diagnosis not present

## 2015-02-22 DIAGNOSIS — I83811 Varicose veins of right lower extremities with pain: Secondary | ICD-10-CM | POA: Diagnosis not present

## 2015-03-19 DIAGNOSIS — I83811 Varicose veins of right lower extremities with pain: Secondary | ICD-10-CM | POA: Diagnosis not present

## 2015-03-19 DIAGNOSIS — I87391 Chronic venous hypertension (idiopathic) with other complications of right lower extremity: Secondary | ICD-10-CM | POA: Diagnosis not present

## 2015-04-01 DIAGNOSIS — I83811 Varicose veins of right lower extremities with pain: Secondary | ICD-10-CM | POA: Diagnosis not present

## 2015-04-15 DIAGNOSIS — I8311 Varicose veins of right lower extremity with inflammation: Secondary | ICD-10-CM | POA: Diagnosis not present

## 2015-04-29 DIAGNOSIS — I83812 Varicose veins of left lower extremities with pain: Secondary | ICD-10-CM | POA: Diagnosis not present

## 2015-05-01 DIAGNOSIS — I83812 Varicose veins of left lower extremities with pain: Secondary | ICD-10-CM | POA: Diagnosis not present

## 2015-05-07 DIAGNOSIS — Z9889 Other specified postprocedural states: Secondary | ICD-10-CM | POA: Diagnosis not present

## 2015-05-07 DIAGNOSIS — Z86010 Personal history of colonic polyps: Secondary | ICD-10-CM | POA: Diagnosis not present

## 2015-05-07 DIAGNOSIS — Z09 Encounter for follow-up examination after completed treatment for conditions other than malignant neoplasm: Secondary | ICD-10-CM | POA: Diagnosis not present

## 2015-05-14 DIAGNOSIS — I83812 Varicose veins of left lower extremities with pain: Secondary | ICD-10-CM | POA: Diagnosis not present

## 2015-05-14 DIAGNOSIS — I83892 Varicose veins of left lower extremities with other complications: Secondary | ICD-10-CM | POA: Diagnosis not present

## 2015-06-11 DIAGNOSIS — I83812 Varicose veins of left lower extremities with pain: Secondary | ICD-10-CM | POA: Diagnosis not present

## 2015-06-11 DIAGNOSIS — I8312 Varicose veins of left lower extremity with inflammation: Secondary | ICD-10-CM | POA: Diagnosis not present

## 2015-06-19 ENCOUNTER — Ambulatory Visit (INDEPENDENT_AMBULATORY_CARE_PROVIDER_SITE_OTHER): Payer: Medicare Other | Admitting: Podiatry

## 2015-06-19 ENCOUNTER — Ambulatory Visit: Payer: Medicare Other | Admitting: Podiatry

## 2015-06-19 DIAGNOSIS — B351 Tinea unguium: Secondary | ICD-10-CM

## 2015-06-20 NOTE — Progress Notes (Signed)
Subjective:     Patient ID: Sheila Rice, female   DOB: [birth year], 69 y.o.   MRN: [id number]  HPI patient states doing better with nails   Review of Systems     Objective:   Physical Exam  neurovascular status intact with diminishment of yellow numbness in the nailbeds    Assessment:      improving mycotic nail infection    Plan:      laser administered approximate 1000 pulses and will be seen back as needed

## 2015-07-25 DIAGNOSIS — I83812 Varicose veins of left lower extremities with pain: Secondary | ICD-10-CM | POA: Diagnosis not present

## 2015-12-10 DIAGNOSIS — H16223 Keratoconjunctivitis sicca, not specified as Sjogren's, bilateral: Secondary | ICD-10-CM | POA: Diagnosis not present

## 2015-12-31 DIAGNOSIS — E559 Vitamin D deficiency, unspecified: Secondary | ICD-10-CM | POA: Diagnosis not present

## 2015-12-31 DIAGNOSIS — Z8639 Personal history of other endocrine, nutritional and metabolic disease: Secondary | ICD-10-CM | POA: Diagnosis not present

## 2015-12-31 DIAGNOSIS — R748 Abnormal levels of other serum enzymes: Secondary | ICD-10-CM | POA: Diagnosis not present

## 2015-12-31 DIAGNOSIS — M810 Age-related osteoporosis without current pathological fracture: Secondary | ICD-10-CM | POA: Diagnosis not present

## 2015-12-31 DIAGNOSIS — I10 Essential (primary) hypertension: Secondary | ICD-10-CM | POA: Diagnosis not present

## 2015-12-31 DIAGNOSIS — E119 Type 2 diabetes mellitus without complications: Secondary | ICD-10-CM | POA: Diagnosis not present

## 2015-12-31 DIAGNOSIS — D649 Anemia, unspecified: Secondary | ICD-10-CM | POA: Diagnosis not present

## 2015-12-31 DIAGNOSIS — Z1389 Encounter for screening for other disorder: Secondary | ICD-10-CM | POA: Diagnosis not present

## 2015-12-31 DIAGNOSIS — E7800 Pure hypercholesterolemia, unspecified: Secondary | ICD-10-CM | POA: Diagnosis not present

## 2016-01-13 DIAGNOSIS — H16223 Keratoconjunctivitis sicca, not specified as Sjogren's, bilateral: Secondary | ICD-10-CM | POA: Diagnosis not present

## 2016-02-12 ENCOUNTER — Other Ambulatory Visit: Payer: Self-pay | Admitting: Gastroenterology

## 2016-02-12 DIAGNOSIS — R1013 Epigastric pain: Secondary | ICD-10-CM

## 2016-02-12 DIAGNOSIS — I160 Hypertensive urgency: Secondary | ICD-10-CM | POA: Diagnosis not present

## 2016-02-12 DIAGNOSIS — K219 Gastro-esophageal reflux disease without esophagitis: Secondary | ICD-10-CM | POA: Diagnosis not present

## 2016-02-14 DIAGNOSIS — I10 Essential (primary) hypertension: Secondary | ICD-10-CM | POA: Diagnosis not present

## 2016-02-19 DIAGNOSIS — I10 Essential (primary) hypertension: Secondary | ICD-10-CM | POA: Diagnosis not present

## 2016-03-02 ENCOUNTER — Other Ambulatory Visit: Payer: Self-pay | Admitting: Gastroenterology

## 2016-03-02 ENCOUNTER — Ambulatory Visit
Admission: RE | Admit: 2016-03-02 | Discharge: 2016-03-02 | Disposition: A | Discharge status: Home / Self Care | Payer: Medicare Other | Source: Ambulatory Visit | Attending: Gastroenterology | Admitting: Gastroenterology

## 2016-03-02 DIAGNOSIS — R1013 Epigastric pain: Secondary | ICD-10-CM

## 2016-03-13 DIAGNOSIS — E559 Vitamin D deficiency, unspecified: Secondary | ICD-10-CM | POA: Diagnosis not present

## 2016-03-13 DIAGNOSIS — Z1231 Encounter for screening mammogram for malignant neoplasm of breast: Secondary | ICD-10-CM | POA: Diagnosis not present

## 2016-03-13 DIAGNOSIS — R748 Abnormal levels of other serum enzymes: Secondary | ICD-10-CM | POA: Diagnosis not present

## 2016-03-13 DIAGNOSIS — E7800 Pure hypercholesterolemia, unspecified: Secondary | ICD-10-CM | POA: Diagnosis not present

## 2016-03-13 DIAGNOSIS — I10 Essential (primary) hypertension: Secondary | ICD-10-CM | POA: Diagnosis not present

## 2016-03-13 DIAGNOSIS — R011 Cardiac murmur, unspecified: Secondary | ICD-10-CM | POA: Diagnosis not present

## 2016-03-13 DIAGNOSIS — E119 Type 2 diabetes mellitus without complications: Secondary | ICD-10-CM | POA: Diagnosis not present

## 2016-03-13 DIAGNOSIS — R945 Abnormal results of liver function studies: Secondary | ICD-10-CM | POA: Diagnosis not present

## 2016-03-13 DIAGNOSIS — Z23 Encounter for immunization: Secondary | ICD-10-CM | POA: Diagnosis not present

## 2016-03-13 DIAGNOSIS — M810 Age-related osteoporosis without current pathological fracture: Secondary | ICD-10-CM | POA: Diagnosis not present

## 2016-03-13 DIAGNOSIS — Z0000 Encounter for general adult medical examination without abnormal findings: Secondary | ICD-10-CM | POA: Diagnosis not present

## 2016-03-13 DIAGNOSIS — K219 Gastro-esophageal reflux disease without esophagitis: Secondary | ICD-10-CM | POA: Diagnosis not present

## 2016-03-16 ENCOUNTER — Other Ambulatory Visit (HOSPITAL_COMMUNITY): Payer: Self-pay | Admitting: Family Medicine

## 2016-03-16 ENCOUNTER — Ambulatory Visit
Admission: RE | Admit: 2016-03-16 | Discharge: 2016-03-16 | Disposition: A | Discharge status: Home / Self Care | Payer: Medicare Other | Source: Ambulatory Visit | Attending: Gastroenterology | Admitting: Gastroenterology

## 2016-03-16 DIAGNOSIS — R011 Cardiac murmur, unspecified: Secondary | ICD-10-CM

## 2016-03-16 DIAGNOSIS — R1013 Epigastric pain: Secondary | ICD-10-CM

## 2016-03-16 DIAGNOSIS — K7689 Other specified diseases of liver: Secondary | ICD-10-CM | POA: Diagnosis not present

## 2016-03-16 MED ORDER — GADOBENATE DIMEGLUMINE 529 MG/ML IV SOLN
11.0000 mL | Freq: Once | INTRAVENOUS | Status: AC | PRN
  Administered 2016-03-16: 11 mL via INTRAVENOUS

## 2016-04-01 ENCOUNTER — Other Ambulatory Visit (HOSPITAL_COMMUNITY): Payer: Medicare Other

## 2016-04-01 ENCOUNTER — Ambulatory Visit (HOSPITAL_COMMUNITY)
Admission: RE | Admit: 2016-04-01 | Discharge: 2016-04-01 | Disposition: A | Discharge status: Home / Self Care | Payer: Medicare Other | Source: Ambulatory Visit | Attending: Family Medicine | Admitting: Family Medicine

## 2016-04-01 DIAGNOSIS — I340 Nonrheumatic mitral (valve) insufficiency: Secondary | ICD-10-CM | POA: Insufficient documentation

## 2016-04-01 DIAGNOSIS — I371 Nonrheumatic pulmonary valve insufficiency: Secondary | ICD-10-CM | POA: Diagnosis not present

## 2016-04-01 DIAGNOSIS — I351 Nonrheumatic aortic (valve) insufficiency: Secondary | ICD-10-CM | POA: Diagnosis not present

## 2016-04-01 DIAGNOSIS — K219 Gastro-esophageal reflux disease without esophagitis: Secondary | ICD-10-CM | POA: Diagnosis not present

## 2016-04-01 DIAGNOSIS — I071 Rheumatic tricuspid insufficiency: Secondary | ICD-10-CM | POA: Diagnosis not present

## 2016-04-01 DIAGNOSIS — R011 Cardiac murmur, unspecified: Secondary | ICD-10-CM | POA: Insufficient documentation

## 2016-04-01 DIAGNOSIS — K2270 Barrett's esophagus without dysplasia: Secondary | ICD-10-CM | POA: Diagnosis not present

## 2016-04-01 DIAGNOSIS — K7689 Other specified diseases of liver: Secondary | ICD-10-CM | POA: Diagnosis not present

## 2016-04-01 NOTE — Progress Notes (Signed)
  Echocardiogram 2D Echocardiogram has been performed.  Sheila Rice 04/01/2016, 4:41 PM

## 2016-04-03 DIAGNOSIS — M810 Age-related osteoporosis without current pathological fracture: Secondary | ICD-10-CM | POA: Diagnosis not present

## 2016-04-03 DIAGNOSIS — Z1231 Encounter for screening mammogram for malignant neoplasm of breast: Secondary | ICD-10-CM | POA: Diagnosis not present

## 2016-04-20 DIAGNOSIS — E7800 Pure hypercholesterolemia, unspecified: Secondary | ICD-10-CM | POA: Diagnosis not present

## 2016-04-20 DIAGNOSIS — R7989 Other specified abnormal findings of blood chemistry: Secondary | ICD-10-CM | POA: Diagnosis not present

## 2016-04-20 DIAGNOSIS — R931 Abnormal findings on diagnostic imaging of heart and coronary circulation: Secondary | ICD-10-CM | POA: Diagnosis not present

## 2016-04-20 DIAGNOSIS — I10 Essential (primary) hypertension: Secondary | ICD-10-CM | POA: Diagnosis not present

## 2016-04-20 DIAGNOSIS — M810 Age-related osteoporosis without current pathological fracture: Secondary | ICD-10-CM | POA: Diagnosis not present

## 2016-04-20 DIAGNOSIS — E119 Type 2 diabetes mellitus without complications: Secondary | ICD-10-CM | POA: Diagnosis not present

## 2016-07-27 DIAGNOSIS — K7689 Other specified diseases of liver: Secondary | ICD-10-CM | POA: Diagnosis not present

## 2016-07-27 DIAGNOSIS — K2270 Barrett's esophagus without dysplasia: Secondary | ICD-10-CM | POA: Diagnosis not present

## 2016-07-27 DIAGNOSIS — K219 Gastro-esophageal reflux disease without esophagitis: Secondary | ICD-10-CM | POA: Diagnosis not present

## 2016-07-27 DIAGNOSIS — R1012 Left upper quadrant pain: Secondary | ICD-10-CM | POA: Diagnosis not present

## 2016-08-17 ENCOUNTER — Other Ambulatory Visit: Payer: Self-pay | Admitting: Gastroenterology

## 2016-08-17 DIAGNOSIS — K209 Esophagitis, unspecified: Secondary | ICD-10-CM | POA: Diagnosis not present

## 2016-08-17 DIAGNOSIS — K219 Gastro-esophageal reflux disease without esophagitis: Secondary | ICD-10-CM

## 2016-08-17 DIAGNOSIS — K449 Diaphragmatic hernia without obstruction or gangrene: Secondary | ICD-10-CM | POA: Diagnosis not present

## 2016-08-17 DIAGNOSIS — K2270 Barrett's esophagus without dysplasia: Secondary | ICD-10-CM | POA: Diagnosis not present

## 2016-08-20 DIAGNOSIS — K209 Esophagitis, unspecified: Secondary | ICD-10-CM | POA: Diagnosis not present

## 2016-08-20 DIAGNOSIS — K2270 Barrett's esophagus without dysplasia: Secondary | ICD-10-CM | POA: Diagnosis not present

## 2016-08-21 DIAGNOSIS — R350 Frequency of micturition: Secondary | ICD-10-CM | POA: Diagnosis not present

## 2016-08-21 DIAGNOSIS — N3001 Acute cystitis with hematuria: Secondary | ICD-10-CM | POA: Diagnosis not present

## 2016-09-04 ENCOUNTER — Other Ambulatory Visit: Payer: Self-pay | Admitting: Gastroenterology

## 2016-09-04 ENCOUNTER — Ambulatory Visit
Admission: RE | Admit: 2016-09-04 | Discharge: 2016-09-04 | Disposition: A | Discharge status: Home / Self Care | Payer: Medicare Other | Source: Ambulatory Visit | Attending: Gastroenterology | Admitting: Gastroenterology

## 2016-09-04 DIAGNOSIS — K219 Gastro-esophageal reflux disease without esophagitis: Secondary | ICD-10-CM | POA: Diagnosis not present

## 2016-09-11 DIAGNOSIS — R1012 Left upper quadrant pain: Secondary | ICD-10-CM | POA: Diagnosis not present

## 2016-09-11 DIAGNOSIS — K2270 Barrett's esophagus without dysplasia: Secondary | ICD-10-CM | POA: Diagnosis not present

## 2016-09-11 DIAGNOSIS — K7689 Other specified diseases of liver: Secondary | ICD-10-CM | POA: Diagnosis not present

## 2016-09-11 DIAGNOSIS — K449 Diaphragmatic hernia without obstruction or gangrene: Secondary | ICD-10-CM | POA: Diagnosis not present

## 2016-09-15 ENCOUNTER — Other Ambulatory Visit: Payer: Self-pay | Admitting: Gastroenterology

## 2016-09-15 DIAGNOSIS — R1012 Left upper quadrant pain: Secondary | ICD-10-CM

## 2016-09-29 DIAGNOSIS — K449 Diaphragmatic hernia without obstruction or gangrene: Secondary | ICD-10-CM | POA: Diagnosis not present

## 2016-09-29 DIAGNOSIS — K210 Gastro-esophageal reflux disease with esophagitis: Secondary | ICD-10-CM | POA: Diagnosis not present

## 2016-10-02 ENCOUNTER — Ambulatory Visit
Admission: RE | Admit: 2016-10-02 | Discharge: 2016-10-02 | Disposition: A | Discharge status: Home / Self Care | Payer: Medicare Other | Source: Ambulatory Visit | Attending: Gastroenterology | Admitting: Gastroenterology

## 2016-10-02 DIAGNOSIS — R1012 Left upper quadrant pain: Secondary | ICD-10-CM

## 2016-10-02 DIAGNOSIS — K7689 Other specified diseases of liver: Secondary | ICD-10-CM | POA: Diagnosis not present

## 2016-10-02 MED ORDER — GADOBENATE DIMEGLUMINE 529 MG/ML IV SOLN
14.0000 mL | Freq: Once | INTRAVENOUS | Status: AC | PRN
  Administered 2016-10-02: 14 mL via INTRAVENOUS

## 2016-11-24 ENCOUNTER — Ambulatory Visit: Payer: Self-pay | Admitting: General Surgery

## 2016-11-24 DIAGNOSIS — K449 Diaphragmatic hernia without obstruction or gangrene: Secondary | ICD-10-CM | POA: Diagnosis not present

## 2016-11-24 DIAGNOSIS — K210 Gastro-esophageal reflux disease with esophagitis: Secondary | ICD-10-CM | POA: Diagnosis not present

## 2016-11-24 NOTE — H&P (Signed)
Sheila Rice 11/24/2016 11:05 AM Location: [facility] Patient #: [id number] DOB: 10-Sep-1945 Widowed / Language: Sheila Rice / Race: White Female  History of Present Illness Sheila Hollingshead MD; 11/24/2016 11:35 AM) The patient is a 71 year old female.   Note:She presents today because she would like to start the scheduling process for her large hiatal hernia repair. She is not having any change in her symptoms. There is no signs of incarceration. She wants to schedule the operation soon after her wedding.  Allergies Sheila Rice, [place]; 11/24/2016 11:05 AM) ACE Inhibitors  Medication History Sheila Rice, RMA; 11/24/2016 11:06 AM) Omeprazole (40MG  Capsule DR, Oral) Active. HydroCHLOROthiazide (12.5MG  Capsule, Oral) Active. Esomeprazole Magnesium (40MG  Capsule DR, Oral) Active. Losartan Potassium (100MG  Tablet, Oral) Active. Atorvastatin Calcium (10MG  Tablet, Oral) Active. Xiidra (5% Solution, Ophthalmic) Active. Calcium Citrate (500MG  Capsule, Oral) Active. (800mg  2daily) Controlled Substance Active. ([place] reviewed. The patient is not showing habitual signs of abuse or chronic narcotic prescription use.) Medications Reconciled    Vitals Sheila Rice RMA; 11/24/2016 11:06 AM) 11/24/2016 11:06 AM Weight: 129.8 lb Height: 60.5in Body Surface Area: 1.56 m Body Mass Index: 24.93 kg/m  Temp.: 97.11F  Pulse: 92 (Regular)  BP: 146/68 (Sitting, Left Arm, Standard)      Physical Exam Sheila Hollingshead MD; 11/24/2016 11:36 AM)  The physical exam findings are as follows: Note:GENERAL APPEARANCE: WDWN in NAD. Pleasant and cooperative.  EARS, NOSE, MOUTH THROAT: [place]/AT external ears: no lesions or deformities external nose: no lesions or deformities hearing: grossly normal lips: moist, no deformities EYES external: conjunctiva, lids, sclerae normal pupils: equal, round glasses: no  NECK: Supple, no obvious mass or thyroid  mass/enlargement, no trachea deviation  CV ascultation: RRR, no murmur extremity edema: no  RESP auscultation: breath sounds equal and clear respiratory effort: normal   GASTROINTESTINAL abdomen: Soft, non-tender, non-distended, no masses liver and spleen: not enlarged. hernia: none present  MUSCULOSKELETAL station and gait: normal digits/nails: no clubbing or cyanosis deformities: none instability: none  SKIN jaundice: none  NEUROLOGIC speech: normal  PSYCHIATRIC alertness and orientation: normal mood/affect/behavior: normal judgement and insight: normal    Assessment & Plan Sheila Hollingshead MD; 11/24/2016 11:40 AM)  HIATAL HERNIA WITH GERD AND ESOPHAGITIS (K44.9) Impression: No significant changes in her history.  Plan: Schedule laparoscopic repair of hiatal hernia (possible absorbable mesh use)and Nissen fundoplication. Procedure, risks, and after care instructions have been discussed with her previously.  Sheila Rice, M.D.

## 2016-12-10 DIAGNOSIS — H40013 Open angle with borderline findings, low risk, bilateral: Secondary | ICD-10-CM | POA: Diagnosis not present

## 2017-01-04 NOTE — Patient Instructions (Addendum)
MARYALICE PASLEY  01/04/2017   Your procedure is scheduled on: 01-15-17   Report to [facility] Main  Entrance Take [place]  Elevators to 3rd floor to  [place] at 8:00 AM.   Call this number if you have problems the morning of surgery [phone]    Remember: ONLY 1 PERSON MAY GO WITH YOU TO SHORT STAY TO GET  READY MORNING OF [place].  Do not eat food or drink liquids :After Midnight.     Take these medicines the morning of surgery with A SIP OF WATER: Esomeprazole (Nexium), Atorvastatin (Lipitor).  You may also bring and use your  eyedrops as needed.                                You may not have any metal on your body including hair pins and              piercings  Do not wear jewelry, make-up, lotions, powders or perfumes, deodorant             Do not wear nail polish.  Do not shave  48 hours prior to surgery.               Do not bring valuables to the hospital. [place].  Contacts, dentures or bridgework may not be worn into surgery.  Leave suitcase in the car. After surgery it may be brought to your room.                 Please read over the following fact sheets you were given: _____________________________________________________________________             [facility] - Preparing for Surgery Before surgery, you can play an important role.  Because skin is not sterile, your skin needs to be as free of germs as possible.  You can reduce the number of germs on your skin by washing with CHG (chlorahexidine gluconate) soap before surgery.  CHG is an antiseptic cleaner which kills germs and bonds with the skin to continue killing germs even after washing. Please DO NOT use if you have an allergy to CHG or antibacterial soaps.  If your skin becomes reddened/irritated stop using the CHG and inform your nurse when you arrive at Short Stay. Do not shave (including legs and underarms) for at least 48  hours prior to the first CHG shower.  You may shave your face/neck. Please follow these instructions carefully:  1.  Shower with CHG Soap the night before surgery and the  morning of Surgery.  2.  If you choose to wash your hair, wash your hair first as usual with your  normal  shampoo.  3.  After you shampoo, rinse your hair and body thoroughly to remove the  shampoo.                           4.  Use CHG as you would any other liquid soap.  You can apply chg directly  to the skin and wash                       Gently with a scrungie  or clean washcloth.  5.  Apply the CHG Soap to your body ONLY FROM THE NECK DOWN.   Do not use on face/ open                           Wound or open sores. Avoid contact with eyes, ears mouth and genitals (private parts).                       Wash face,  Genitals (private parts) with your normal soap.             6.  Wash thoroughly, paying special attention to the area where your surgery  will be performed.  7.  Thoroughly rinse your body with warm water from the neck down.  8.  DO NOT shower/wash with your normal soap after using and rinsing off  the CHG Soap.                9.  Pat yourself dry with a clean towel.            10.  Wear clean pajamas.            11.  Place clean sheets on your bed the night of your first shower and do not  sleep with pets. Day of Surgery : Do not apply any lotions/deodorants the morning of surgery.  Please wear clean clothes to the hospital/surgery center.  FAILURE TO FOLLOW THESE INSTRUCTIONS MAY RESULT IN THE CANCELLATION OF YOUR SURGERY PATIENT SIGNATURE_________________________________  NURSE SIGNATURE__________________________________  ________________________________________________________________________

## 2017-01-04 NOTE — Progress Notes (Signed)
04-01-16 (EPIC) ECHO no aortic stenosis, mild regurgitation

## 2017-01-11 ENCOUNTER — Encounter (HOSPITAL_COMMUNITY)
Admission: RE | Admit: 2017-01-11 | Discharge: 2017-01-11 | Disposition: A | Discharge status: Home / Self Care | Payer: Medicare Other | Source: Ambulatory Visit | Attending: General Surgery | Admitting: General Surgery

## 2017-01-11 ENCOUNTER — Encounter (HOSPITAL_COMMUNITY): Payer: Self-pay | Admitting: *Deleted

## 2017-01-11 DIAGNOSIS — Z01818 Encounter for other preprocedural examination: Secondary | ICD-10-CM | POA: Insufficient documentation

## 2017-01-11 DIAGNOSIS — K449 Diaphragmatic hernia without obstruction or gangrene: Secondary | ICD-10-CM | POA: Insufficient documentation

## 2017-01-11 DIAGNOSIS — I491 Atrial premature depolarization: Secondary | ICD-10-CM | POA: Diagnosis not present

## 2017-01-11 DIAGNOSIS — K219 Gastro-esophageal reflux disease without esophagitis: Secondary | ICD-10-CM | POA: Diagnosis not present

## 2017-01-11 DIAGNOSIS — I10 Essential (primary) hypertension: Secondary | ICD-10-CM | POA: Diagnosis not present

## 2017-01-11 HISTORY — DX: Gastro-esophageal reflux disease without esophagitis: K21.9

## 2017-01-11 LAB — CBC WITH DIFFERENTIAL/PLATELET
Basophils Absolute: 0 10*3/uL (ref 0.0–0.1)
Basophils Relative: 0 %
Eosinophils Absolute: 0.3 10*3/uL (ref 0.0–0.7)
Eosinophils Relative: 5 %
HCT: 37.4 % (ref 36.0–46.0)
Hemoglobin: 12.2 g/dL (ref 12.0–15.0)
Lymphocytes Relative: 19 %
Lymphs Abs: 1.1 10*3/uL (ref 0.7–4.0)
MCH: 30.2 pg (ref 26.0–34.0)
MCHC: 32.6 g/dL (ref 30.0–36.0)
MCV: 92.6 fL (ref 78.0–100.0)
Monocytes Absolute: 0.5 10*3/uL (ref 0.1–1.0)
Monocytes Relative: 9 %
Neutro Abs: 3.9 10*3/uL (ref 1.7–7.7)
Neutrophils Relative %: 67 %
Platelets: 200 10*3/uL (ref 150–400)
RBC: 4.04 MIL/uL (ref 3.87–5.11)
RDW: 12.5 % (ref 11.5–15.5)
WBC: 5.8 10*3/uL (ref 4.0–10.5)

## 2017-01-11 LAB — COMPREHENSIVE METABOLIC PANEL
ALT: 29 U/L (ref 14–54)
AST: 28 U/L (ref 15–41)
Albumin: 4 g/dL (ref 3.5–5.0)
Alkaline Phosphatase: 79 U/L (ref 38–126)
Anion gap: 9 (ref 5–15)
BUN: 14 mg/dL (ref 6–20)
CO2: 29 mmol/L (ref 22–32)
Calcium: 9.4 mg/dL (ref 8.9–10.3)
Chloride: 103 mmol/L (ref 101–111)
Creatinine, Ser: 0.84 mg/dL (ref 0.44–1.00)
GFR calc Af Amer: >60 mL/min (ref >60)
GFR calc non Af Amer: >60 mL/min (ref >60)
Glucose, Bld: 108 mg/dL — ABNORMAL HIGH (ref 65–99)
Potassium: 4.8 mmol/L (ref 3.5–5.1)
Sodium: 141 mmol/L (ref 135–145)
Total Bilirubin: 0.5 mg/dL (ref 0.3–1.2)
Total Protein: 7 g/dL (ref 6.5–8.1)

## 2017-01-15 ENCOUNTER — Encounter (HOSPITAL_COMMUNITY): Payer: Self-pay | Admitting: *Deleted

## 2017-01-15 ENCOUNTER — Inpatient Hospital Stay (HOSPITAL_COMMUNITY): Payer: Medicare Other | Admitting: Anesthesiology

## 2017-01-15 ENCOUNTER — Encounter (HOSPITAL_COMMUNITY)
Admission: RE | Disposition: A | Discharge status: Home / Self Care | Payer: Self-pay | Source: Ambulatory Visit | Attending: General Surgery

## 2017-01-15 ENCOUNTER — Inpatient Hospital Stay (HOSPITAL_COMMUNITY)
Admission: RE | Admit: 2017-01-15 | Discharge: 2017-01-18 | DRG: 328 | Disposition: A | Discharge status: Home / Self Care | Payer: Medicare Other | Source: Ambulatory Visit | Attending: General Surgery | Admitting: General Surgery

## 2017-01-15 DIAGNOSIS — D649 Anemia, unspecified: Secondary | ICD-10-CM | POA: Diagnosis present

## 2017-01-15 DIAGNOSIS — K449 Diaphragmatic hernia without obstruction or gangrene: Secondary | ICD-10-CM | POA: Diagnosis present

## 2017-01-15 DIAGNOSIS — Z888 Allergy status to other drugs, medicaments and biological substances status: Secondary | ICD-10-CM | POA: Diagnosis not present

## 2017-01-15 DIAGNOSIS — T40605A Adverse effect of unspecified narcotics, initial encounter: Secondary | ICD-10-CM | POA: Diagnosis not present

## 2017-01-15 DIAGNOSIS — R110 Nausea: Secondary | ICD-10-CM | POA: Diagnosis not present

## 2017-01-15 DIAGNOSIS — K219 Gastro-esophageal reflux disease without esophagitis: Secondary | ICD-10-CM | POA: Diagnosis present

## 2017-01-15 DIAGNOSIS — I10 Essential (primary) hypertension: Secondary | ICD-10-CM | POA: Diagnosis present

## 2017-01-15 HISTORY — PX: LAPAROSCOPIC NISSEN FUNDOPLICATION: SHX1932

## 2017-01-15 LAB — TYPE AND SCREEN
ABO/RH(D): A POS
Antibody Screen: NEGATIVE

## 2017-01-15 SURGERY — FUNDOPLICATION, NISSEN, LAPAROSCOPIC
Anesthesia: General | Site: Abdomen

## 2017-01-15 MED ORDER — ONDANSETRON 4 MG PO TBDP: 4.0000 mg | ORAL_TABLET | Freq: Four times a day (QID) | ORAL | Status: DC | PRN

## 2017-01-15 MED ORDER — ROCURONIUM BROMIDE 50 MG/5ML IV SOSY
PREFILLED_SYRINGE | INTRAVENOUS | Status: DC | PRN
  Administered 2017-01-15: 10 mg via INTRAVENOUS
  Administered 2017-01-15: 40 mg via INTRAVENOUS
  Administered 2017-01-15: 10 mg via INTRAVENOUS
  Administered 2017-01-15: 5 mg via INTRAVENOUS

## 2017-01-15 MED ORDER — PANTOPRAZOLE SODIUM 40 MG IV SOLR
40.0000 mg | Freq: Every day | INTRAVENOUS | Status: DC
  Administered 2017-01-15 – 2017-01-16 (×2): 40 mg via INTRAVENOUS
  Filled 2017-01-15 (×2): qty 40

## 2017-01-15 MED ORDER — ONDANSETRON HCL 4 MG/2ML IJ SOLN
INTRAMUSCULAR | Status: AC
  Filled 2017-01-15: qty 2

## 2017-01-15 MED ORDER — 0.9 % SODIUM CHLORIDE (POUR BTL) OPTIME
TOPICAL | Status: DC | PRN
  Administered 2017-01-15: 1000 mL

## 2017-01-15 MED ORDER — SUGAMMADEX SODIUM 200 MG/2ML IV SOLN
INTRAVENOUS | Status: AC
  Filled 2017-01-15: qty 2

## 2017-01-15 MED ORDER — ACETAMINOPHEN 10 MG/ML IV SOLN
INTRAVENOUS | Status: AC
  Filled 2017-01-15: qty 100

## 2017-01-15 MED ORDER — LIFITEGRAST 5 % OP SOLN: 1.0000 [drp] | Freq: Two times a day (BID) | OPHTHALMIC | Status: DC

## 2017-01-15 MED ORDER — SUCCINYLCHOLINE CHLORIDE 200 MG/10ML IV SOSY
PREFILLED_SYRINGE | INTRAVENOUS | Status: AC
  Filled 2017-01-15: qty 10

## 2017-01-15 MED ORDER — EPHEDRINE SULFATE 50 MG/ML IJ SOLN
INTRAMUSCULAR | Status: DC | PRN
  Administered 2017-01-15 (×3): 10 mg via INTRAVENOUS

## 2017-01-15 MED ORDER — ONDANSETRON HCL 4 MG/2ML IJ SOLN
4.0000 mg | INTRAMUSCULAR | Status: AC
  Administered 2017-01-15 – 2017-01-16 (×4): 4 mg via INTRAVENOUS
  Filled 2017-01-15 (×4): qty 2

## 2017-01-15 MED ORDER — FENTANYL CITRATE (PF) 100 MCG/2ML IJ SOLN
25.0000 ug | INTRAMUSCULAR | Status: AC | PRN
  Administered 2017-01-15 (×4): 50 ug via INTRAVENOUS
  Administered 2017-01-15: 25 ug via INTRAVENOUS
  Administered 2017-01-15: 50 ug via INTRAVENOUS

## 2017-01-15 MED ORDER — DEXAMETHASONE SODIUM PHOSPHATE 10 MG/ML IJ SOLN
INTRAMUSCULAR | Status: AC
  Filled 2017-01-15: qty 1

## 2017-01-15 MED ORDER — SUGAMMADEX SODIUM 200 MG/2ML IV SOLN
INTRAVENOUS | Status: DC | PRN
  Administered 2017-01-15: 150 mg via INTRAVENOUS

## 2017-01-15 MED ORDER — ONDANSETRON HCL 4 MG/2ML IJ SOLN
INTRAMUSCULAR | Status: DC | PRN
  Administered 2017-01-15: 4 mg via INTRAVENOUS

## 2017-01-15 MED ORDER — BUPIVACAINE HCL (PF) 0.5 % IJ SOLN
INTRAMUSCULAR | Status: AC
  Filled 2017-01-15: qty 30

## 2017-01-15 MED ORDER — DEXAMETHASONE SODIUM PHOSPHATE 10 MG/ML IJ SOLN
INTRAMUSCULAR | Status: DC | PRN
  Administered 2017-01-15: 10 mg via INTRAVENOUS

## 2017-01-15 MED ORDER — CHLORHEXIDINE GLUCONATE CLOTH 2 % EX PADS: 6.0000 | MEDICATED_PAD | Freq: Once | CUTANEOUS | Status: DC

## 2017-01-15 MED ORDER — EPHEDRINE 5 MG/ML INJ
INTRAVENOUS | Status: AC
  Filled 2017-01-15: qty 10

## 2017-01-15 MED ORDER — FENTANYL CITRATE (PF) 100 MCG/2ML IJ SOLN
25.0000 ug | INTRAMUSCULAR | Status: DC | PRN
  Administered 2017-01-15: 25 ug via INTRAVENOUS

## 2017-01-15 MED ORDER — LIP MEDEX EX OINT
TOPICAL_OINTMENT | CUTANEOUS | Status: AC
  Filled 2017-01-15: qty 7

## 2017-01-15 MED ORDER — ACETAMINOPHEN 10 MG/ML IV SOLN
1000.0000 mg | Freq: Once | INTRAVENOUS | Status: AC
  Administered 2017-01-15: 1000 mg via INTRAVENOUS

## 2017-01-15 MED ORDER — HYDRALAZINE HCL 20 MG/ML IJ SOLN
20.0000 mg | INTRAMUSCULAR | Status: DC | PRN
  Filled 2017-01-15: qty 1

## 2017-01-15 MED ORDER — FENTANYL CITRATE (PF) 100 MCG/2ML IJ SOLN
INTRAMUSCULAR | Status: AC
  Administered 2017-01-15: 25 ug via INTRAVENOUS
  Filled 2017-01-15: qty 4

## 2017-01-15 MED ORDER — ROCURONIUM BROMIDE 50 MG/5ML IV SOSY
PREFILLED_SYRINGE | INTRAVENOUS | Status: AC
  Filled 2017-01-15: qty 5

## 2017-01-15 MED ORDER — SUCCINYLCHOLINE CHLORIDE 200 MG/10ML IV SOSY
PREFILLED_SYRINGE | INTRAVENOUS | Status: DC | PRN
  Administered 2017-01-15: 100 mg via INTRAVENOUS

## 2017-01-15 MED ORDER — BUPIVACAINE HCL (PF) 0.5 % IJ SOLN
INTRAMUSCULAR | Status: DC | PRN
  Administered 2017-01-15: 20 mL

## 2017-01-15 MED ORDER — MORPHINE SULFATE (PF) 2 MG/ML IV SOLN
2.0000 mg | INTRAVENOUS | Status: DC | PRN
  Administered 2017-01-15: 4 mg via INTRAVENOUS
  Administered 2017-01-15 (×2): 2 mg via INTRAVENOUS
  Administered 2017-01-16: 4 mg via INTRAVENOUS
  Administered 2017-01-16: 2 mg via INTRAVENOUS
  Administered 2017-01-16 (×2): 4 mg via INTRAVENOUS
  Administered 2017-01-16 (×2): 2 mg via INTRAVENOUS
  Administered 2017-01-16 (×2): 4 mg via INTRAVENOUS
  Administered 2017-01-17: 2 mg via INTRAVENOUS
  Filled 2017-01-15: qty 1
  Filled 2017-01-15 (×6): qty 2
  Filled 2017-01-15 (×2): qty 1
  Filled 2017-01-15: qty 2
  Filled 2017-01-15 (×2): qty 1

## 2017-01-15 MED ORDER — LACTATED RINGERS IV SOLN
INTRAVENOUS | Status: DC
  Administered 2017-01-15 (×2): via INTRAVENOUS

## 2017-01-15 MED ORDER — KCL IN DEXTROSE-NACL 20-5-0.9 MEQ/L-%-% IV SOLN
INTRAVENOUS | Status: DC
  Administered 2017-01-15 – 2017-01-16 (×2): via INTRAVENOUS
  Administered 2017-01-17: 1000 mL via INTRAVENOUS
  Filled 2017-01-15 (×4): qty 1000

## 2017-01-15 MED ORDER — OXYCODONE HCL 5 MG PO TABS
5.0000 mg | ORAL_TABLET | ORAL | Status: DC | PRN
  Filled 2017-01-15: qty 2

## 2017-01-15 MED ORDER — ENOXAPARIN SODIUM 40 MG/0.4ML ~~LOC~~ SOLN
40.0000 mg | SUBCUTANEOUS | Status: DC
  Administered 2017-01-16 – 2017-01-18 (×3): 40 mg via SUBCUTANEOUS
  Filled 2017-01-15 (×3): qty 0.4

## 2017-01-15 MED ORDER — LIDOCAINE 2% (20 MG/ML) 5 ML SYRINGE
INTRAMUSCULAR | Status: DC | PRN
  Administered 2017-01-15: 60 mg via INTRAVENOUS

## 2017-01-15 MED ORDER — LACTATED RINGERS IR SOLN
Status: DC | PRN
  Administered 2017-01-15: 1000 mL

## 2017-01-15 MED ORDER — PROPOFOL 10 MG/ML IV BOLUS
INTRAVENOUS | Status: DC | PRN
  Administered 2017-01-15: 100 mg via INTRAVENOUS

## 2017-01-15 MED ORDER — CEFAZOLIN SODIUM-DEXTROSE 2-4 GM/100ML-% IV SOLN
2.0000 g | INTRAVENOUS | Status: AC
  Administered 2017-01-15: 2 g via INTRAVENOUS
  Filled 2017-01-15: qty 100

## 2017-01-15 MED ORDER — PROPOFOL 10 MG/ML IV BOLUS
INTRAVENOUS | Status: AC
  Filled 2017-01-15: qty 20

## 2017-01-15 MED ORDER — FENTANYL CITRATE (PF) 100 MCG/2ML IJ SOLN
INTRAMUSCULAR | Status: AC
  Filled 2017-01-15: qty 2

## 2017-01-15 MED ORDER — ONDANSETRON HCL 4 MG/2ML IJ SOLN
4.0000 mg | Freq: Four times a day (QID) | INTRAMUSCULAR | Status: DC | PRN
  Administered 2017-01-16 – 2017-01-17 (×5): 4 mg via INTRAVENOUS
  Filled 2017-01-15 (×5): qty 2

## 2017-01-15 MED ORDER — LIDOCAINE 2% (20 MG/ML) 5 ML SYRINGE
INTRAMUSCULAR | Status: AC
  Filled 2017-01-15: qty 5

## 2017-01-15 SURGICAL SUPPLY — 52 items
APL SKNCLS STERI-STRIP NONHPOA (GAUZE/BANDAGES/DRESSINGS) ×1
APPLIER CLIP 5 13 M/L LIGAMAX5 (MISCELLANEOUS) ×2
APPLIER CLIP ROT 10 11.4 M/L (STAPLE)
APR CLP MED LRG 11.4X10 (STAPLE)
APR CLP MED LRG 5 ANG JAW (MISCELLANEOUS) ×1
BENZOIN TINCTURE PRP APPL 2/3 (GAUZE/BANDAGES/DRESSINGS) ×2 IMPLANT
CABLE HIGH FREQUENCY MONO STRZ (ELECTRODE) ×1 IMPLANT
CHLORAPREP W/TINT 26ML (MISCELLANEOUS) ×2 IMPLANT
CLIP APPLIE 5 13 M/L LIGAMAX5 (MISCELLANEOUS) IMPLANT
CLIP APPLIE ROT 10 11.4 M/L (STAPLE) IMPLANT
DECANTER SPIKE VIAL GLASS SM (MISCELLANEOUS) ×2 IMPLANT
DEVICE SUT QUICK LOAD TK 5 (STAPLE) ×9 IMPLANT
DEVICE SUT TI-KNOT TK 5X26 (MISCELLANEOUS) ×2 IMPLANT
DEVICE SUTURE ENDOST 10MM (ENDOMECHANICALS) ×2 IMPLANT
DISSECTOR BLUNT TIP ENDO 5MM (MISCELLANEOUS) ×2 IMPLANT
DRAIN CHANNEL 19F RND (DRAIN) IMPLANT
DRAIN PENROSE 18X1/2 LTX STRL (DRAIN) ×2 IMPLANT
DRSG TEGADERM 2-3/8X2-3/4 SM (GAUZE/BANDAGES/DRESSINGS) ×1 IMPLANT
ELECT REM PT RETURN 15FT ADLT (MISCELLANEOUS) ×2 IMPLANT
EVACUATOR SILICONE 100CC (DRAIN) IMPLANT
FILTER SMOKE EVAC LAPAROSHD (FILTER) ×2 IMPLANT
GAUZE SPONGE 2X2 8PLY STRL LF (GAUZE/BANDAGES/DRESSINGS) IMPLANT
GLOVE ECLIPSE 8.0 STRL XLNG CF (GLOVE) ×2 IMPLANT
GLOVE INDICATOR 8.0 STRL GRN (GLOVE) ×2 IMPLANT
GOWN STRL REUS W/TWL XL LVL3 (GOWN DISPOSABLE) ×8 IMPLANT
HOLDER FOLEY CATH W/STRAP (MISCELLANEOUS) ×1 IMPLANT
IRRIG SUCT STRYKERFLOW 2 WTIP (MISCELLANEOUS)
IRRIGATION SUCT STRKRFLW 2 WTP (MISCELLANEOUS) ×1 IMPLANT
KIT BASIN OR (CUSTOM PROCEDURE TRAY) ×2 IMPLANT
PAD POSITIONING PINK XL (MISCELLANEOUS) ×2 IMPLANT
SCISSORS LAP 5X35 DISP (ENDOMECHANICALS) ×2 IMPLANT
SET IRRIG TUBING LAPAROSCOPIC (IRRIGATION / IRRIGATOR) ×1 IMPLANT
SHEARS HARMONIC ACE PLUS 36CM (ENDOMECHANICALS) ×2 IMPLANT
SLEEVE XCEL OPT CAN 5 100 (ENDOMECHANICALS) ×6 IMPLANT
SPONGE DRAIN TRACH 4X4 STRL 2S (GAUZE/BANDAGES/DRESSINGS) IMPLANT
SPONGE GAUZE 2X2 STER 10/PKG (GAUZE/BANDAGES/DRESSINGS) ×1
STRIP CLOSURE SKIN 1/2X4 (GAUZE/BANDAGES/DRESSINGS) ×2 IMPLANT
SUT ETHILON 3 0 PS 1 (SUTURE) IMPLANT
SUT MNCRL AB 4-0 PS2 18 (SUTURE) ×3 IMPLANT
SUT SURGIDAC NAB ES-9 0 48 120 (SUTURE) ×12 IMPLANT
TIP INNERVISION DETACH 40FR (MISCELLANEOUS) IMPLANT
TIP INNERVISION DETACH 50FR (MISCELLANEOUS) ×1 IMPLANT
TIP INNERVISION DETACH 56FR (MISCELLANEOUS) IMPLANT
TIPS INNERVISION DETACH 40FR (MISCELLANEOUS)
TOWEL OR 17X26 10 PK STRL BLUE (TOWEL DISPOSABLE) ×2 IMPLANT
TOWEL OR NON WOVEN STRL DISP B (DISPOSABLE) ×2 IMPLANT
TRAY FOLEY CATH 14FRSI W/METER (CATHETERS) ×1 IMPLANT
TRAY LAPAROSCOPIC (CUSTOM PROCEDURE TRAY) ×2 IMPLANT
TROCAR BLADELESS OPT 5 100 (ENDOMECHANICALS) ×2 IMPLANT
TROCAR XCEL BLUNT TIP 100MML (ENDOMECHANICALS) IMPLANT
TROCAR XCEL NON-BLD 11X100MML (ENDOMECHANICALS) ×2 IMPLANT
TUBING INSUF HEATED (TUBING) ×2 IMPLANT

## 2017-01-15 NOTE — Op Note (Signed)
OPERATIVE NOTE-HIATAL HERNIA REPAIR AND NISSEN FUNDOPLICATION  PREOPERATIVE DX:  Symptomatic large hiatal hernia with gastroesophageal reflux disease  POSTOPERATIVE DX:  Same (90% of stomach and chest)  PROCEDURE:   1. Laparoscopic hiatal hernia repair. 2. Laparoscopic Nissen fundoplication (over a 56 French dilator) .          Surgeon: Odis Hollingshead   Assistants:  Alphonsa Overall M.D.  Anesthesia: General endotracheal anesthesia  Indications:   This is 71 year old who has a symptomatic hiatal hernia and gastroesophageal reflux. She now presents for the above procedure.  Procedure Detail:  She was brought to the operating room and placed supine on the operating table and a general anesthetic was given. A Foley catheter was inserted. An oral gastric tube was inserted. The abdominal wall was widely sterilely prepped and draped. A timeout was performed.  He/She was placed in slight reverse Trendelenburg position. A 5 mm incision was made in the left subcostal area.  Using a 5 mm Optiview trocar and laparoscope,  Access was gained into the peritoneal cavity and a pneumoperitoneum created by insufflation of carbon dioxide gas. Inspection of the area underneath the trocar demonstrated no evidence of organ injury or bleeding.  A 5 mm trocar was placed just to the left of the umbilicus.  A 5 mm trocar was placed in the right upper quadrant. An 11 mm trocar was placed in the epigastrium.  A 5 mm trocar was placed  In the left lateral abdomen.  A 5 mm incision was made in the upper epigastrium just to the left of midline. A self retaining liver retractor was placed into the abdominal cavity. The left lobe of the liver was then retracted anteriorly exposing a large hiatal hernia with 90% of the stomach up in the mediastinum.  The thin gastrohepatic ligament was divided proximally up toward the right crus.  I then began dissecting the sac free from the right crus, anterior crus and left crus using blunt  dissection and the Harmonic scalpel. Then I dissected the sac free from the posterior crural area and was able to the reduce the stomach back into the abdominal cavity.  I then carefully mobilized the esophagus by dividing thin filmy lateral adhesions as far proximally as I could. The esophagus was shortened and I was able to reduce the stomach such that the gastroesophageal junction was at or slightly above the esophageal hiatus but I was unable to reduce it any further. There was no evidence of injury to the stomach or the esophagus.  A retroesophageal window was created using blunt dissection and a Penrose drain was placed through the window and used to retract the GE junction.  I then debrided the sac free from the stomach using the harmonic scalpel. Short gastric vessels of the fundus were divided mobilizing the fundus. The hiatal hernia defect was then closed with interrupted size 0 non-absorbable sutures. When adequate closure was achieved, the penrose drain was removed.  The fundus of the stomach was then passed posterior to the esophagus. A size 56 dilator was then passed into the stomach. A 161,  2.5 cm fundoplication was then performed with interrupted size 0 non-absorbable sutures. The dilator was removed and was intact. The wrap was floppy and under no tension. Then performed a gastropexy anchoring the right and left side of the wrap to the diaphragm with 0 nonabsorbable suture.  The abdominal cavity was then inspected in all quadrants. There is no evidence of bleeding or organ injury. The liver retractor  was removed. The pneumoperitoneum was released and the trocars were removed.  The skin incisions were closed with 4-0 Monocryl subcuticular stitches. Steri-Strips and sterile dressings were applied.  She tolerated the procedure well without any apparent complications. She was taken to the recovery room in satisfactory condition.

## 2017-01-15 NOTE — Anesthesia Procedure Notes (Signed)
Procedure Name: Intubation Date/Time: 01/15/2017 9:58 AM Performed by: Dione Booze Pre-anesthesia Checklist: Suction available, Emergency Drugs available, Patient identified and Patient being monitored Patient Re-evaluated:Patient Re-evaluated prior to induction Oxygen Delivery Method: Circle system utilized Preoxygenation: Pre-oxygenation with 100% oxygen Induction Type: IV induction, Rapid sequence and Cricoid Pressure applied Laryngoscope Size: Mac Grade View: Grade II Tube type: Oral Tube size: 7.5 mm Number of attempts: 1 Airway Equipment and Method: Stylet Placement Confirmation: ETT inserted through vocal cords under direct vision,  positive ETCO2 and breath sounds checked- equal and bilateral Secured at: 21 cm Tube secured with: Tape Dental Injury: Teeth and Oropharynx as per pre-operative assessment

## 2017-01-15 NOTE — Interval H&P Note (Signed)
History and Physical Interval Note:  01/15/2017 9:53 AM  Sheila Rice  has presented today for surgery, with the diagnosis of Large hiatal hernia and GERD  The various methods of treatment have been discussed with the patient and family. After consideration of risks, benefits and other options for treatment, the patient has consented to  Procedure(s): LAPAROSCOPIC NISSEN FUNDOPLICATION and [place] (N/A) as a surgical intervention .  The patient's history has been reviewed, patient examined, no change in status, stable for surgery.  I have reviewed the patient's chart and labs.  Questions were answered to the patient's satisfaction.     Jovanni Eckhart Lenna Sciara

## 2017-01-15 NOTE — H&P (Signed)
Sheila Rice is an 71 y.o. female.   Chief Complaint:  Here for elective surgery HPI:  This is a 71 year old female who has a symptomatic large hiatal hernia with intermittent gastroesophageal reflux. She now presents for elective laparoscopic hiatal hernia repair and Nissen fundoplication.  Past Medical History:  Diagnosis Date  . GERD (gastroesophageal reflux disease)   . Hypertension     Past Surgical History:  Procedure Laterality Date  . BACK SURGERY     Spinal Stenosis  . BUNIONECTOMY      History reviewed. No pertinent family history. Social History:  reports that she has never smoked. She has never used smokeless tobacco. She reports that she drinks alcohol. She reports that she does not use drugs.  Allergies:  Allergies  Allergen Reactions  . Ace Inhibitors Cough    Medications Prior to Admission  Medication Sig Dispense Refill  . Aspirin-Salicylamide-Caffeine (ARTHRITIS STRENGTH BC POWDER PO) Take 1 packet by mouth 2 (two) times daily as needed (pain).    Marland Kitchen atorvastatin (LIPITOR) 10 MG tablet Take 10 mg by mouth daily.    Marland Kitchen CALCIUM CITRATE PO Take 1,600 mg by mouth daily. Takes (2) 800 mg tablets daily    . esomeprazole (NEXIUM) 40 MG capsule Take 40 mg by mouth daily.    . hydrochlorothiazide (HYDRODIURIL) 12.5 MG tablet Take 12.5 mg by mouth daily.    Marland Kitchen Lifitegrast (XIIDRA) 5 % SOLN Place 1 drop into both eyes 2 (two) times daily.    Marland Kitchen losartan (COZAAR) 100 MG tablet Take 100 mg by mouth daily.    Marland Kitchen terbinafine (LAMISIL) 250 MG tablet 1 week a month for 4 months (Patient not taking: Reported on 01/05/2017) 28 tablet 0  . terbinafine (LAMISIL) 250 MG tablet Take one tablet once daily x 7 days, then repeat every month x 4 months (Patient not taking: Reported on 01/05/2017) 28 tablet 0    No results found for this or any previous visit (from the past 48 hour(s)). No results found.  Review of Systems  Constitutional: Negative for chills and fever.  Respiratory:  Negative for cough.   Gastrointestinal: Negative for diarrhea and heartburn.    Blood pressure (!) 177/61, pulse 72, temperature 98.8 F (37.1 C), temperature source Oral, resp. rate 18, height 5' 0.5" (1.537 m), weight 59.4 kg (131 lb), SpO2 99 %. Physical Exam  Constitutional:  Slightly overweight female in no acute distress.  HENT:  Head: Normocephalic and atraumatic.  Eyes: No scleral icterus.  Cardiovascular: Regular rhythm.   Respiratory: Effort normal and breath sounds normal.  GI: Soft. There is no tenderness.  Lower midline scar. Small subumbilical scar  Musculoskeletal: She exhibits no edema.  Neurological: She is alert.  Skin: Skin is warm and dry.  Psychiatric: She has a normal mood and affect. Her behavior is normal.     Assessment/Plan Symptomatic large hiatal hernia.  Plan: Laparoscopic hiatal hernia and Nissen fundoplication. The procedure, risks, and after care had been discussed with her in detail previously.  Odis Hollingshead, MD 01/15/2017, 9:51 AM

## 2017-01-15 NOTE — Anesthesia Postprocedure Evaluation (Signed)
Anesthesia Post Note  Patient: BRUNILDA EBLE  Procedure(s) Performed: Procedure(s) (LRB): LAPAROSCOPIC NISSEN FUNDOPLICATION and HIATAL HERNIA REPAIR (N/A)     Patient location during evaluation: PACU Anesthesia Type: General Level of consciousness: awake and alert Pain management: pain level controlled Vital Signs Assessment: post-procedure vital signs reviewed and stable Respiratory status: spontaneous breathing, nonlabored ventilation, respiratory function stable and patient connected to nasal cannula oxygen Cardiovascular status: blood pressure returned to baseline and stable Postop Assessment: no signs of nausea or vomiting Anesthetic complications: no    Last Vitals:  Vitals:   01/15/17 1330 01/15/17 1345  BP: (!) 149/53 (!) 108/59  Pulse: 65 77  Resp: 12 17  Temp:      Last Pain:  Vitals:   01/15/17 1330  TempSrc:   PainSc: Asleep                 Felishia Wartman,W. EDMOND

## 2017-01-15 NOTE — Discharge Instructions (Signed)
[facility]  HIATAL HERNIA REPAIR AFTERCARE INSTRUCTIONS  Start on a Level 1 diet (see below) and do not advance until you see MD in office No lifting, pushing or pulling over 10 pounds for 2 month. Do not overeat! Walk frequently. You may drive when you are pain-free. Minimize bending and squatting. Call for high fever (>101.5), vomiting, inability to swallow, wound problems.   EATING AFTER YOUR HIATAL HERNIA REPAIR SURGERY  After your esophageal surgery, you can expect some difficulty swallowing.  If food sticks when you eat, it is called "dysphagia".  This is due to swelling around your surgery site and will most likely resolve within a few weeks.  To help you through this temporary phase, we start you out on a pureed diet.  Your first meal in the hospital was clear liquids.  You should have been given a pureed diet by the time you left the hospital.  We ask patients to stay on a pureed diet for the first two weeks to avoid anything getting "stuck" near your recent surgery.  Don't be alarmed if your ability to swallow doesn't progress according to this plan.  Everyone is different and some take longer or shorter.  Use common sense.  If you are having trouble swallowing a particular food, then avoid it.  If food is sticking when you advance your diet, go back to the previous day or two.  In general some simple rules to follow are:  Maintain an upright position (as near 90 degrees as possible) whenever eating or drinking.  Take small bites - only 1/2 to 1 teaspoon at a time.  Eat slowly.  It may also help to eat only one food at a time.  Avoid talking while eating.  Do not mix solid foods and liquids in the same mouthful and do not "wash foods down" with liquids, unless you have been instructed to do so by your surgeon.  Eat in a relaxed atmosphere, with no distractions.  Following each meal, sit in an upright position (90 degree angle) for 60-90 minutes.  Avoid  carbonated (bubbly) drinks. Do not use straws.  If food does stick, don't panic.  Try to relax and let the food pass on its own.  Sipping strong hot black tea can also help.  If you have any questions please call our office at [phone].   LEVEL 1 PUREED FOODS:  1ST 2-3 WEEKS AFTER SURGERY Foods in this group are pureed or blenderized to a smooth, mashed potato-like consistency.  If necessary, the pureed foods can keep their shape with the addition of a thickening agent.  Meat should be pureed to a smooth pasty consistency.  Hot broth or gravy may be added to the pureed meat, approximately 1 oz. of liquid per 3 oz. serving of meat. CAUTION:  If any foods do not puree into a smooth consistency, it may make eating for swallowing more difficult.  For example, zucchini seeds sometimes do not blend well. Hot Foods Cold Foods  Pureed scrambled eggs and cheese Pureed cottage cheese  Baby cereals Thickened juices and nectars  Thinned cooked cereals (no lumps) Thickened milk or eggnog  Pureed [place] toast or pancakes Ensure  Mashed potatoes Ice cream  Pureed parsley, au gratin, scalloped potatoes, candied sweet potatoes Fruit or [place] ice, sherbet  Pureed buttered or alfredo noodles Plain yogurt  Pureed vegetables (no corn or peas) Instant breakfast  Pureed soups and creamed soups Smooth pudding, mousse, custard  Pureed scalloped apples  Whipped gelatin  Gravies Sugar, syrup, honey, jelly  Sauces, cheese, tomato, barbecue, white, creamed Cream  Any baby food Creamer  Alcohol in moderation (not beer or champagne) Margarine  Coffee or tea Mayonnaise   Ketchup, mustard   Apple sauce   SAMPLE MENU:  PUREED DIET Breakfast Lunch Dinner   Orange juice, 1/2 cup  Cream of wheat, 1/2 cup  Pineapple juice, 1/2 cup  Pureed [place], barley soup, 3/4 cup  Pureed Hawaiian chicken, 3 oz   Scrambled eggs, mashed or blended with cheese, 1/2 cup  Tea or coffee, 1 cup   Whole milk, 1 cup    Non-dairy creamer, 2 Tbsp.  Mashed potatoes, 1/2 cup  Pureed cooled broccoli, 1/2 cup  Apple sauce, 1/2 cup  Coffee or tea  Mashed potatoes, 1/2 cup  Pureed spinach, 1/2 cup  Frozen yogurt, 1/2 cup  Tea or coffee    LEVEL 2 After your first 2 weeks, you can advance to a soft diet.  Keep on this diet until everything goes down easily. Hot Foods Cold Foods  White fish Cottage cheese  Stuffed fish Junior baby fruit  Baby food meals Semi thickened juices  Minced soft cooked, scrambled, poached eggs nectars  Souffle & omelets Ripe mashed bananas  Cooked cereals Canned fruit, pineapple sauce, milk  potatoes Milkshake  Buttered or Alfredo noodles Custard  Cooked cooled vegetable Puddings, including tapioca  Sherbet Yogurt  Vegetable soup or alphabet soup Fruit ice, [place] ice  Gravies Whipped gelatin  Sugar, syrup, honey, jelly Junior baby desserts  Sauces:  Cheese, creamed, barbecue, tomato, white Cream  Coffee or tea Margarine   SAMPLE MENU:  LEVEL 2 Breakfast Lunch Dinner   Orange juice, 1/2 cup  Oatmeal, 1/2 cup  Scrambled eggs with cheese, 1/2 cup  Decaffeinated tea, 1 cup  Whole milk, 1 cup  Non-dairy creamer, 2 Tbsp  Pineapple juice, 1/2 cup  Minced beef, 3 oz  Gravy, 2 Tbsp  Mashed potatoes, 1/2 cup  Minced fresh broccoli, 1/2 cup  Applesauce, 1/2 cup  Coffee, 1 cup  [place], barley soup, 3/4 cup  Minced Hawaiian chicken, 3 oz  Mashed potatoes, 1/2 cup  Cooked spinach, 1/2 cup  Frozen yogurt, 1/2 cup  Non-dairy creamer, 2 Tbsp    LEVEL 3 After all the foods in level 2 (soft diet) are passing through well you should advance up to the next level.  It is still important to cut these foods into small pieces and eat slowly. Hot Foods Cold Foods  Poultry Cottage cheese  Chopped Swedish meatballs Yogurt  Meat salads (ground or flaked meat) Milk  Flaked fish (tuna) Milkshakes  Poached or scrambled eggs Soft, cold, dry cereal  Souffles  and omelets Fruit juices or nectars  Cooked cereals Chopped canned fruit  Chopped [place] toast or pancakes Canned fruit cocktail  Noodles or pasta (no rice) Pudding, mousse, custard  Cooked vegetables (no frozen peas, corn, or mixed vegetables) Green salad  Canned small sweet peas Ice cream  Creamed soup or vegetable soup Fruit ice, [place] ice  Pureed vegetable soup or alphabet soup Non-dairy creamer  Ground scalloped apples Margarine  Gravies Mayonnaise  Sauces:  Cheese, creamed, barbecue, tomato, white Ketchup  Coffee or tea Mustard   SAMPLE MENU:  LEVEL 3 Breakfast Lunch Dinner   Orange juice, 1/2 cup  Oatmeal, 1/2 cup  Scrambled eggs with cheese, 1/2 cup  Decaffeinated tea, 1 cup  Whole milk, 1 cup  Non-dairy creamer, 2 Tbsp  Ketchup, 1  Tbsp  Margarine, 1 tsp  Salt, 1/4 tsp  Sugar, 2 tsp  Pineapple juice, 1/2 cup  Ground beef, 3 oz  Gravy, 2 Tbsp  Mashed potatoes, 1/2 cup  Cooked spinach, 1/2 cup  Applesauce, 1/2 cup  Decaffeinated coffee  Whole milk  Non-dairy creamer, 2 Tbsp  Margarine, 1 tsp  Salt, 1/4 tsp  Pureed [place], barley soup, 3/4 cup  Barbecue chicken, 3 oz  Mashed potatoes, 1/2 cup  Ground fresh broccoli, 1/2 cup  Frozen yogurt, 1/2 cup  Decaffeinated tea, 1 cup  Non-dairy creamer, 2 Tbsp  Margarine, 1 tsp  Salt, 1/4 tsp  Sugar, 1 tsp    LEVEL 4:  REGULAR FOODS Foods in this group are soft, moist, regularly textured foods.  This level includes red meat and breads, which tend to be the hardest things to swallow.  Eat very slow, chew well and continue to avoid carbonated drinks. Hot Foods Cold Foods  Baked fish or skinned Soft cheeses - cottage cheese  Souffles and omelets Cream cheese  Eggs Yogurt  Stuffed shells Milk  Spaghetti with meat sauce Milkshakes  Cooked cereal Cold dry cereals (no nuts, dried fruit, coconut)  [place] toast or pancakes Crackers  Buttered toast Fruit juices or nectars  Noodles or pasta (no  rice) Canned fruit  Potatoes (all types) Ripe bananas  Soft, cooked vegetables (no corn, lima, or baked beans) Peeled, ripe, fresh fruit  Creamed soups or vegetable soup Cakes (no nuts, dried fruit, coconut)  Canned chicken noodle soup Plain doughnuts  Gravies Ice cream  Bacon dressing Pudding, mousse, custard  Sauces:  Cheese, creamed, barbecue, tomato, white Fruit ice, [place] ice, sherbet  Decaffeinated tea or coffee Whipped gelatin  Pork chops Regular gelatin   Canned fruited gelatin molds   Sugar, syrup, honey, jam, jelly   Cream   Non-dairy   Margarine   Oil   Mayonnaise   Ketchup   Mustard

## 2017-01-15 NOTE — Anesthesia Preprocedure Evaluation (Addendum)
Anesthesia Evaluation  Patient identified by MRN, date of birth, ID band Patient awake    Reviewed: Allergy & Precautions, H&P , NPO status , Patient's Chart, lab work & pertinent test results  Airway Mallampati: II  TM Distance: >3 FB Neck ROM: Full    Dental no notable dental hx. (+) Teeth Intact, Dental Advisory Given   Pulmonary neg pulmonary ROS,    Pulmonary exam normal breath sounds clear to auscultation       Cardiovascular hypertension, Pt. on medications  Rhythm:Regular Rate:Normal     Neuro/Psych negative neurological ROS  negative psych ROS   GI/Hepatic Neg liver ROS, GERD  Medicated and Controlled,  Endo/Other  negative endocrine ROS  Renal/GU negative Renal ROS  negative genitourinary   Musculoskeletal   Abdominal   Peds  Hematology negative hematology ROS (+)   Anesthesia Other Findings   Reproductive/Obstetrics negative OB ROS                            Anesthesia Physical Anesthesia Plan  ASA: II  Anesthesia Plan: General   Post-op Pain Management:    Induction: Intravenous  PONV Risk Score and Plan: 4 or greater and Ondansetron, Dexamethasone, Midazolam and Treatment may vary due to age or medical condition  Airway Management Planned: Oral ETT  Additional Equipment:   Intra-op Plan:   Post-operative Plan: Extubation in OR  Informed Consent: I have reviewed the patients History and Physical, chart, labs and discussed the procedure including the risks, benefits and alternatives for the proposed anesthesia with the patient or authorized representative who has indicated his/her understanding and acceptance.   Dental advisory given  Plan Discussed with: CRNA  Anesthesia Plan Comments:        Anesthesia Quick Evaluation

## 2017-01-15 NOTE — Transfer of Care (Signed)
Immediate Anesthesia Transfer of Care Note  Patient: Sheila Rice  Procedure(s) Performed: Procedure(s): LAPAROSCOPIC NISSEN FUNDOPLICATION and HIATAL HERNIA REPAIR (N/A)  Patient Location: PACU  Anesthesia Type:General  Level of Consciousness: awake, alert , oriented and patient cooperative  Airway & Oxygen Therapy: Patient Spontanous Breathing and Patient connected to face mask oxygen  Post-op Assessment: Report given to RN and Post -op Vital signs reviewed and stable  Post vital signs: Reviewed and stable  Last Vitals:  Vitals:   01/15/17 0815  BP: (!) 177/61  Pulse: 72  Resp: 18  Temp: 37.1 C    Last Pain:  Vitals:   01/15/17 0815  TempSrc: Oral         Complications: No apparent anesthesia complications

## 2017-01-16 ENCOUNTER — Inpatient Hospital Stay (HOSPITAL_COMMUNITY): Payer: Medicare Other

## 2017-01-16 ENCOUNTER — Encounter (HOSPITAL_COMMUNITY): Payer: Self-pay | Admitting: Surgery

## 2017-01-16 DIAGNOSIS — I10 Essential (primary) hypertension: Secondary | ICD-10-CM | POA: Diagnosis present

## 2017-01-16 DIAGNOSIS — K219 Gastro-esophageal reflux disease without esophagitis: Secondary | ICD-10-CM | POA: Diagnosis present

## 2017-01-16 LAB — BASIC METABOLIC PANEL
Anion gap: 7 (ref 5–15)
BUN: 14 mg/dL (ref 6–20)
CO2: 29 mmol/L (ref 22–32)
Calcium: 8.2 mg/dL — ABNORMAL LOW (ref 8.9–10.3)
Chloride: 104 mmol/L (ref 101–111)
Creatinine, Ser: 0.74 mg/dL (ref 0.44–1.00)
GFR calc Af Amer: >60 mL/min (ref >60)
GFR calc non Af Amer: >60 mL/min (ref >60)
Glucose, Bld: 177 mg/dL — ABNORMAL HIGH (ref 65–99)
Potassium: 4.3 mmol/L (ref 3.5–5.1)
Sodium: 140 mmol/L (ref 135–145)

## 2017-01-16 LAB — CBC
HCT: 29.9 % — ABNORMAL LOW (ref 36.0–46.0)
Hemoglobin: 9.8 g/dL — ABNORMAL LOW (ref 12.0–15.0)
MCH: 30.2 pg (ref 26.0–34.0)
MCHC: 32.8 g/dL (ref 30.0–36.0)
MCV: 92 fL (ref 78.0–100.0)
Platelets: 151 10*3/uL (ref 150–400)
RBC: 3.25 MIL/uL — ABNORMAL LOW (ref 3.87–5.11)
RDW: 12.7 % (ref 11.5–15.5)
WBC: 10.3 10*3/uL (ref 4.0–10.5)

## 2017-01-16 MED ORDER — LIDOCAINE 5 % EX PTCH
1.0000 | MEDICATED_PATCH | Freq: Every day | CUTANEOUS | Status: DC
  Administered 2017-01-16 – 2017-01-18 (×3): 1 via TRANSDERMAL
  Filled 2017-01-16 (×3): qty 1

## 2017-01-16 MED ORDER — LOSARTAN POTASSIUM 50 MG PO TABS
100.0000 mg | ORAL_TABLET | Freq: Every day | ORAL | Status: DC
  Administered 2017-01-16 – 2017-01-18 (×3): 100 mg via ORAL
  Filled 2017-01-16 (×3): qty 2

## 2017-01-16 MED ORDER — IOPAMIDOL (ISOVUE-300) INJECTION 61%
100.0000 mL | Freq: Once | INTRAVENOUS | Status: AC | PRN
  Administered 2017-01-16: 100 mL via ORAL

## 2017-01-16 MED ORDER — HYDROCHLOROTHIAZIDE 25 MG PO TABS
12.5000 mg | ORAL_TABLET | Freq: Every day | ORAL | Status: DC
  Administered 2017-01-16 – 2017-01-18 (×3): 12.5 mg via ORAL
  Filled 2017-01-16 (×3): qty 1

## 2017-01-16 MED ORDER — MENTHOL 3 MG MT LOZG
1.0000 | LOZENGE | OROMUCOSAL | Status: DC | PRN
  Filled 2017-01-16: qty 9

## 2017-01-16 NOTE — Progress Notes (Signed)
[facility] Office:  [phone] General Surgery Progress Note   LOS: 1 day  POD -  1 Day Post-Op  Chief Complaint: Hiatal hernia  Assessment and Plan: 1.  LAPAROSCOPIC NISSEN FUNDOPLICATION and HIATAL HERNIA REPAIR - 01/15/2017 - [facility]  UGI pending  Main complaint is right shoulder pain - ? Diaphragmatic irritation  2.  Anemia, chronic (Hgb only 10.3 preop)  Hgb - 9.8 - 01/16/2017  She said that she has had this before.  She has had a recent colonoscopy. 3.  DVT prophylaxis - Lovenox   Active Problems:   Hiatal hernia with gastroesophageal reflux   Subjective:  Doing well with abdomen - Main complaint is right shoulder pain.   Going down to radiology now.  Husband at bedside.  Objective:   Vitals:   01/16/17 0126 01/16/17 0541  BP: 125/62 (!) 115/56  Pulse: 70 77  Resp: 18 18  Temp: 98.3 F (36.8 C) 98.4 F (36.9 C)     Intake/Output from previous day:  07/13 0701 - 07/14 0700 In: 2975.3 [I.V.:2875.3; IV Piggyback:100] Out: 1300 [Urine:1200; Blood:100]  Intake/Output this shift:  No intake/output data recorded.   Physical Exam:   General: WN older WF who is alert and oriented.    HEENT: Normal. Pupils equal. .   Lungs: Clear   Abdomen: Soft   Wound: Clean.   Lab Results:    Recent Labs  01/16/17 0517  WBC 10.3  HGB 9.8*  HCT 29.9*  PLT 151    BMET   Recent Labs  01/16/17 0517  NA 140  K 4.3  CL 104  CO2 29  GLUCOSE 177*  BUN 14  CREATININE 0.74  CALCIUM 8.2*    PT/INR  No results for input(s): LABPROT, INR in the last 72 hours.  ABG  No results for input(s): PHART, HCO3 in the last 72 hours.  Invalid input(s): PCO2, PO2   Studies/Results:  No results found.   Anti-infectives:   Anti-infectives    Start     Dose/Rate Route Frequency Ordered Stop   01/15/17 0820  ceFAZolin (ANCEF) IVPB 2g/100 mL premix     2 g 200 mL/hr over 30 Minutes Intravenous On call to O.R. 01/15/17 0820 01/15/17 1029      Alphonsa Overall, MD, FACS Pager: [facility] Office: [phone] 01/16/2017

## 2017-01-17 MED ORDER — TRAMADOL HCL 50 MG PO TABS: 50.0000 mg | ORAL_TABLET | Freq: Four times a day (QID) | ORAL | Status: DC | PRN

## 2017-01-17 MED ORDER — GUAIFENESIN-DM 100-10 MG/5ML PO SYRP: 10.0000 mL | ORAL_SOLUTION | ORAL | Status: DC | PRN

## 2017-01-17 MED ORDER — PANTOPRAZOLE SODIUM 40 MG PO TBEC
40.0000 mg | DELAYED_RELEASE_TABLET | Freq: Two times a day (BID) | ORAL | Status: DC
  Administered 2017-01-17 – 2017-01-18 (×2): 40 mg via ORAL
  Filled 2017-01-17 (×2): qty 1

## 2017-01-17 MED ORDER — ALUM & MAG HYDROXIDE-SIMETH 200-200-20 MG/5ML PO SUSP: 30.0000 mL | Freq: Four times a day (QID) | ORAL | Status: DC | PRN

## 2017-01-17 MED ORDER — HYDROMORPHONE HCL-NACL 0.5-0.9 MG/ML-% IV SOSY: 0.5000 mg | PREFILLED_SYRINGE | INTRAVENOUS | Status: DC | PRN

## 2017-01-17 MED ORDER — BISMUTH SUBSALICYLATE 262 MG/15ML PO SUSP: 30.0000 mL | Freq: Three times a day (TID) | ORAL | Status: DC | PRN

## 2017-01-17 MED ORDER — MENTHOL 3 MG MT LOZG: 1.0000 | LOZENGE | OROMUCOSAL | Status: DC | PRN

## 2017-01-17 MED ORDER — FUROSEMIDE 40 MG PO TABS
40.0000 mg | ORAL_TABLET | Freq: Once | ORAL | Status: AC
  Administered 2017-01-17: 40 mg via ORAL
  Filled 2017-01-17: qty 1

## 2017-01-17 MED ORDER — HYDROCORTISONE 1 % EX CREA: 1.0000 "application " | TOPICAL_CREAM | Freq: Three times a day (TID) | CUTANEOUS | Status: DC | PRN

## 2017-01-17 MED ORDER — SODIUM CHLORIDE 0.9 % IV SOLN
25.0000 mg | Freq: Four times a day (QID) | INTRAVENOUS | Status: DC | PRN
  Filled 2017-01-17: qty 1

## 2017-01-17 MED ORDER — PHENOL 1.4 % MT LIQD: 1.0000 | OROMUCOSAL | Status: DC | PRN

## 2017-01-17 MED ORDER — LIP MEDEX EX OINT
1.0000 "application " | TOPICAL_OINTMENT | Freq: Two times a day (BID) | CUTANEOUS | Status: DC
  Administered 2017-01-17 – 2017-01-18 (×3): 1 via TOPICAL
  Filled 2017-01-17 (×2): qty 7

## 2017-01-17 MED ORDER — DIPHENHYDRAMINE HCL 50 MG/ML IJ SOLN: 12.5000 mg | Freq: Four times a day (QID) | INTRAMUSCULAR | Status: DC | PRN

## 2017-01-17 MED ORDER — DEXTROSE 5 % IV SOLN
1000.0000 mg | Freq: Four times a day (QID) | INTRAVENOUS | Status: DC | PRN
  Filled 2017-01-17: qty 10

## 2017-01-17 MED ORDER — METOPROLOL TARTRATE 5 MG/5ML IV SOLN: 5.0000 mg | Freq: Four times a day (QID) | INTRAVENOUS | Status: DC | PRN

## 2017-01-17 MED ORDER — HYDRALAZINE HCL 20 MG/ML IJ SOLN
5.0000 mg | Freq: Four times a day (QID) | INTRAMUSCULAR | Status: DC | PRN
  Filled 2017-01-17: qty 1

## 2017-01-17 MED ORDER — HYDROCORTISONE 2.5 % RE CREA: 1.0000 "application " | TOPICAL_CREAM | Freq: Four times a day (QID) | RECTAL | Status: DC | PRN

## 2017-01-17 MED ORDER — MAGIC MOUTHWASH
15.0000 mL | Freq: Four times a day (QID) | ORAL | Status: DC | PRN
  Filled 2017-01-17: qty 15

## 2017-01-17 MED ORDER — SODIUM CHLORIDE 0.9% FLUSH
3.0000 mL | Freq: Two times a day (BID) | INTRAVENOUS | Status: DC
  Administered 2017-01-17 – 2017-01-18 (×3): 3 mL via INTRAVENOUS

## 2017-01-17 MED ORDER — ACETAMINOPHEN 500 MG PO TABS
1000.0000 mg | ORAL_TABLET | Freq: Three times a day (TID) | ORAL | Status: DC
  Administered 2017-01-17 – 2017-01-18 (×3): 1000 mg via ORAL
  Filled 2017-01-17 (×3): qty 2

## 2017-01-17 MED ORDER — SODIUM CHLORIDE 0.9 % IV SOLN: 250.0000 mL | INTRAVENOUS | Status: DC | PRN

## 2017-01-17 MED ORDER — SACCHAROMYCES BOULARDII 250 MG PO CAPS
250.0000 mg | ORAL_CAPSULE | Freq: Two times a day (BID) | ORAL | Status: DC
  Administered 2017-01-18: 250 mg via ORAL
  Filled 2017-01-17 (×2): qty 1

## 2017-01-17 MED ORDER — METHOCARBAMOL 500 MG PO TABS: 1000.0000 mg | ORAL_TABLET | Freq: Four times a day (QID) | ORAL | Status: DC | PRN

## 2017-01-17 MED ORDER — SODIUM CHLORIDE 0.9% FLUSH: 3.0000 mL | INTRAVENOUS | Status: DC | PRN

## 2017-01-17 MED ORDER — ATORVASTATIN CALCIUM 10 MG PO TABS
10.0000 mg | ORAL_TABLET | Freq: Every day | ORAL | Status: DC
  Administered 2017-01-17 – 2017-01-18 (×2): 10 mg via ORAL
  Filled 2017-01-17 (×2): qty 1

## 2017-01-17 MED ORDER — LACTATED RINGERS IV BOLUS (SEPSIS): 1000.0000 mL | Freq: Three times a day (TID) | INTRAVENOUS | Status: DC | PRN

## 2017-01-17 MED ORDER — PROCHLORPERAZINE EDISYLATE 5 MG/ML IJ SOLN
5.0000 mg | Freq: Four times a day (QID) | INTRAMUSCULAR | Status: DC | PRN
  Administered 2017-01-17: 10 mg via INTRAVENOUS
  Filled 2017-01-17 (×2): qty 2

## 2017-01-17 MED ORDER — PSYLLIUM 95 % PO PACK
1.0000 | PACK | Freq: Every day | ORAL | Status: DC
  Filled 2017-01-17: qty 1

## 2017-01-17 NOTE — Progress Notes (Signed)
[facility]  [address] [place], [place] [address] Phone: [phone]  FAX: [phone]      LASHONDRA VAQUERANO [id number] [birth year]  CARE TEAM:  PCP: [name], MD  Outpatient Care Team: Patient Care Team: [name], MD as PCP - General (Family Medicine)  Inpatient Treatment Team: Treatment Team: Attending Provider: Jackolyn Confer, MD; Technician: Sueanne Margarita, NT; Registered Nurse: Consuela Mimes, RN; Registered Nurse: Lottie Dawson, RN   Problem List:   Principal Problem:   Hiatal hernia s/p lap repair & Niseen fundoplication 8/78/6767 Active Problems:   Hypertension   GERD (gastroesophageal reflux disease)   2 Days Post-Op  01/15/2017  PREOPERATIVE DX:  Symptomatic large hiatal hernia with gastroesophageal reflux disease  POSTOPERATIVE DX:  Same (90% of stomach and chest)  PROCEDURE:   1. Laparoscopic hiatal hernia repair. 2. Laparoscopic Nissen fundoplication (over a 56 French dilator) .         Surgeon: Odis Hollingshead   Assessment  OK  Plan:  -switch pain meds w nausea -adv pureed diet -d/c IVF -HTN control - cozarr & diuretic.  Add PRN -PPI for GERD, etc -VTE prophylaxis- SCDs, etc -mobilize as tolerated to help recovery  D/C patient from hospital when patient meets criteria (anticipate in 1-2 day(s)):  Tolerating oral intake well Ambulating well Adequate pain control without IV medications Urinating  Having flatus Disposition planning in place   20 minutes spent in review, evaluation, examination, counseling, and coordination of care.  More than 50% of that time was spent in counseling.  Adin Hector, M.D., F.A.C.S. Gastrointestinal and Minimally Invasive Surgery [facility] [address], [place], [place] [address] [phone] Main / Paging   01/17/2017    Subjective: (Chief complaint)  Nausea w pain meds Sore at  shoulders especially   Objective:  Vital signs:  Vitals:   01/16/17 0541 01/16/17 1337 01/16/17 2136 01/17/17 0512  BP: (!) 115/56 (!) 132/57 (!) 144/63 (!) 161/82  Pulse: 77 72 75 87  Resp: '18 18 18 18  '$ Temp: 98.4 F (36.9 C) 98.2 F (36.8 C) 99.1 F (37.3 C) 99 F (37.2 C)  TempSrc: Oral Oral Oral Oral  SpO2: 98% 93% 94% 94%  Weight:      Height:        Last BM Date: 01/15/17  Intake/Output   Yesterday:  07/14 0701 - 07/15 0700 In: 0  Out: 650 [Urine:650] This shift:  No intake/output data recorded.  Bowel function:  Flatus: No  BM:  No  Drain: (No drain)   Physical Exam:  General: Pt awake/alert/oriented x4 in no acute distress Eyes: PERRL, normal EOM.  Sclera clear.  No icterus Neuro: CN II-XII intact w/o focal sensory/motor deficits. Lymph: No head/neck/groin lymphadenopathy Psych:  No delerium/psychosis/paranoia HENT: Normocephalic, Mucus membranes moist.  No thrush Neck: Supple, No tracheal deviation Chest: No chest wall pain w good excursion CV:  Pulses intact.  Regular rhythm MS: Normal AROM mjr joints.  No obvious deformity  Abdomen: Soft.  Mildy distended.  Mildly tender at incisions only.  No evidence of peritonitis.  No incarcerated hernias.  Ext:  No deformity.  No mjr edema.  No cyanosis Skin: No petechiae / purpura  Results:   Labs: Results for orders placed or performed during the hospital encounter of 01/15/17 (from the past 48 hour(s))  CBC     Status: Abnormal   Collection Time: 01/16/17  5:17 AM  Result Value Ref  Range   WBC 10.3 4.0 - 10.5 K/uL   RBC 3.25 (L) 3.87 - 5.11 MIL/uL   Hemoglobin 9.8 (L) 12.0 - 15.0 g/dL   HCT 29.9 (L) 36.0 - 46.0 %   MCV 92.0 78.0 - 100.0 fL   MCH 30.2 26.0 - 34.0 pg   MCHC 32.8 30.0 - 36.0 g/dL   RDW 12.7 11.5 - 15.5 %   Platelets 151 150 - 400 K/uL  Basic metabolic panel     Status: Abnormal   Collection Time: 01/16/17  5:17 AM  Result Value Ref Range   Sodium 140 135 - 145 mmol/L    Potassium 4.3 3.5 - 5.1 mmol/L   Chloride 104 101 - 111 mmol/L   CO2 29 22 - 32 mmol/L   Glucose, Bld 177 (H) 65 - 99 mg/dL   BUN 14 6 - 20 mg/dL   Creatinine, Ser 0.74 0.44 - 1.00 mg/dL   Calcium 8.2 (L) 8.9 - 10.3 mg/dL   GFR calc non Af Amer >60 >60 mL/min   GFR calc Af Amer >60 >60 mL/min    Comment: (NOTE) The eGFR has been calculated using the CKD EPI equation. This calculation has not been validated in all clinical situations. eGFR's persistently <60 mL/min signify possible Chronic Kidney Disease.    Anion gap 7 5 - 15    Imaging / Studies: Dg Esophagus W/water Sol Cm  Result Date: 01/16/2017 CLINICAL DATA:  Post Nissen fundoplication EXAM: ESOPHOGRAM/BARIUM SWALLOW TECHNIQUE: Single contrast examination was performed using water-soluble contrast. FLUOROSCOPY TIME:  1 minutes and 18 seconds.  20.8 mGy. COMPARISON:  09/04/2016 FINDINGS: The large hiatal hernia has markedly improved after fundoplication. Contrast fills the esophagus and easily traverses across the gastroesophageal junction into the lumen of the stomach which is located be low the diaphragm. There is minimal dilatation above the gastroesophageal junction which may represent a small residual hiatal hernia. There is no evidence of extravasated enteric contrast. IMPRESSION: Marked improvement after reduction of the large hernia and Nissen fundoplication. The gastroesophageal junction is patent without evidence of contrast extravasation. Electronically Signed   By: Marybelle Killings M.D.   On: 01/16/2017 11:00    Medications / Allergies: per chart  Antibiotics: Anti-infectives    Start     Dose/Rate Route Frequency Ordered Stop   01/15/17 0820  ceFAZolin (ANCEF) IVPB 2g/100 mL premix     2 g 200 mL/hr over 30 Minutes Intravenous On call to O.R. 01/15/17 0820 01/15/17 1029        Note: Portions of this report may have been transcribed using voice recognition software. Every effort was made to ensure accuracy; however,  inadvertent computerized transcription errors may be present.   Any transcriptional errors that result from this process are unintentional.     Adin Hector, M.D., F.A.C.S. Gastrointestinal and Minimally Invasive Surgery [facility] [address] [place], [place] [phone] Main / Paging   01/17/2017

## 2017-01-18 MED ORDER — ONDANSETRON HCL 4 MG PO TABS: 4.0000 mg | ORAL_TABLET | ORAL | 0 refills | Status: DC | PRN

## 2017-01-18 MED ORDER — SODIUM CHLORIDE 0.9 % IV SOLN: 25.0000 mg | Freq: Four times a day (QID) | INTRAVENOUS | Status: DC | PRN

## 2017-01-18 MED ORDER — OXYCODONE HCL 5 MG PO TABS: 5.0000 mg | ORAL_TABLET | ORAL | 0 refills | Status: DC | PRN

## 2017-01-18 MED ORDER — BISMUTH SUBSALICYLATE 262 MG/15ML PO SUSP: 30.0000 mL | Freq: Three times a day (TID) | ORAL | Status: DC | PRN

## 2017-01-18 NOTE — Progress Notes (Signed)
Assessment Principal Problem:   Hiatal hernia s/p lap repair & Niseen fundoplication 8/84/[id number] well Active Problems:   Hypertension   GERD (gastroesophageal reflux disease)   Plan:  Discharge today.  Instructions given to her.   LOS: 3 days     3 Days Post-Op  Chief Complaint/Subjective: Feeling better today.  Swallowing better.  Bowels moving.  Husband at bedside.  Objective: Vital signs in last 24 hours: Temp:  [98.1 F (36.7 C)-99 F (37.2 C)] 99 F (37.2 C) (07/16 0547) Pulse Rate:  [75-86] 86 (07/16 0547) Resp:  [15-18] 15 (07/16 0547) BP: (164-168)/(61-79) 164/79 (07/16 0547) SpO2:  [92 %-94 %] 94 % (07/16 0547) Last BM Date: 01/17/17  Intake/Output from previous day: 07/15 0701 - 07/16 0700 In: 240 [P.O.:240] Out: -  Intake/Output this shift: Total I/O In: 240 [P.O.:240] Out: -   PE: General- In NAD.  Awake and alert. Abdomen-soft, incisions are clean and intact  Lab Results:   Recent Labs  01/16/17 0517  WBC 10.3  HGB 9.8*  HCT 29.9*  PLT 151   BMET  Recent Labs  01/16/17 0517  NA 140  K 4.3  CL 104  CO2 29  GLUCOSE 177*  BUN 14  CREATININE 0.74  CALCIUM 8.2*   PT/INR No results for input(s): LABPROT, INR in the last 72 hours. Comprehensive Metabolic Panel:    Component Value Date/Time   NA 140 01/16/2017 0517   NA 141 01/11/2017 1130   K 4.3 01/16/2017 0517   K 4.8 01/11/2017 1130   CL 104 01/16/2017 0517   CL 103 01/11/2017 1130   CO2 29 01/16/2017 0517   CO2 29 01/11/2017 1130   BUN 14 01/16/2017 0517   BUN 14 01/11/2017 1130   CREATININE 0.74 01/16/2017 0517   CREATININE 0.84 01/11/2017 1130   GLUCOSE 177 (H) 01/16/2017 0517   GLUCOSE 108 (H) 01/11/2017 1130   CALCIUM 8.2 (L) 01/16/2017 0517   CALCIUM 9.4 01/11/2017 1130   AST 28 01/11/2017 1130   AST 52 (H) 06/20/2008 0850   ALT 29 01/11/2017 1130   ALT 40 (H) 06/20/2008 0850   ALKPHOS 79 01/11/2017 1130   ALKPHOS 61 06/20/2008 0850   BILITOT 0.5  01/11/2017 1130   BILITOT 0.6 06/20/2008 0850   PROT 7.0 01/11/2017 1130   PROT 6.5 06/20/2008 0850   ALBUMIN 4.0 01/11/2017 1130   ALBUMIN 3.8 06/20/2008 0850     Studies/Results: Dg Esophagus W/water Sol Cm  Result Date: 01/16/2017 CLINICAL DATA:  Post Nissen fundoplication EXAM: ESOPHOGRAM/BARIUM SWALLOW TECHNIQUE: Single contrast examination was performed using water-soluble contrast. FLUOROSCOPY TIME:  1 minutes and 18 seconds.  20.8 mGy. COMPARISON:  09/04/2016 FINDINGS: The large hiatal hernia has markedly improved after fundoplication. Contrast fills the esophagus and easily traverses across the gastroesophageal junction into the lumen of the stomach which is located be low the diaphragm. There is minimal dilatation above the gastroesophageal junction which may represent a small residual hiatal hernia. There is no evidence of extravasated enteric contrast. IMPRESSION: Marked improvement after reduction of the large hernia and Nissen fundoplication. The gastroesophageal junction is patent without evidence of contrast extravasation. Electronically Signed   By: Marybelle Killings M.D.   On: 01/16/2017 11:00    Anti-infectives: Anti-infectives    Start     Dose/Rate Route Frequency Ordered Stop   01/15/17 0820  ceFAZolin (ANCEF) IVPB 2g/100 mL premix     2 g 200 mL/hr over 30 Minutes Intravenous On call to O.R. 01/15/17 3557  01/15/17 1029       Cardarius Senat J 01/18/2017

## 2017-01-22 NOTE — Discharge Summary (Signed)
Physician Discharge Summary  Patient ID: Sheila Rice MRN: [id number] DOB/AGE: 07/30/1945 71 y.o.  Admit date: 01/15/2017 Discharge date: 01/18/2017  Admission Diagnoses:  Hiatal Hernia  Discharge Diagnoses:  Principal Problem:   Hiatal hernia s/p lap repair & Nissen fundoplication 10/19/8307 Active Problems:   Hypertension   GERD (gastroesophageal reflux disease)   Discharged Condition: good  Hospital Course: She underwent the above procedure.  On POD #1 she had a UGI which showed no complications from the procedure.  She was started on clear liquids.  She was advanced to full liquids which she tolerated well.  She was able to be discharged POD #3.  Discharge instructions were given to her.   Discharge Exam: Blood pressure (!) 164/79, pulse 86, temperature 99 F (37.2 C), temperature source Oral, resp. rate 15, height 5' 0.5" (1.537 m), weight 59.4 kg (131 lb), SpO2 94 %.   Disposition: 01-Home or Self Care   Allergies as of 01/18/2017      Reactions   Ace Inhibitors Cough      Medication List    STOP taking these medications   ARTHRITIS STRENGTH BC POWDER PO     TAKE these medications   atorvastatin 10 MG tablet Commonly known as:  LIPITOR Take 10 mg by mouth daily.   CALCIUM CITRATE PO Take 1,600 mg by mouth daily. Takes (2) 800 mg tablets daily   esomeprazole 40 MG capsule Commonly known as:  NEXIUM Take 40 mg by mouth daily.   hydrochlorothiazide 12.5 MG tablet Commonly known as:  HYDRODIURIL Take 12.5 mg by mouth daily.   losartan 100 MG tablet Commonly known as:  COZAAR Take 100 mg by mouth daily.   ondansetron 4 MG tablet Commonly known as:  ZOFRAN Take 1 tablet (4 mg total) by mouth every 4 (four) hours as needed for nausea or vomiting.   oxyCODONE 5 MG immediate release tablet Commonly known as:  Oxy IR/ROXICODONE Take 1 tablet (5 mg total) by mouth every 4 (four) hours as needed for moderate pain, severe pain or breakthrough pain.    terbinafine 250 MG tablet Commonly known as:  LAMISIL 1 week a month for 4 months   terbinafine 250 MG tablet Commonly known as:  LAMISIL Take one tablet once daily x 7 days, then repeat every month x 4 months   XIIDRA 5 % Soln Generic drug:  Lifitegrast Place 1 drop into both eyes 2 (two) times daily.        Signed: Odis Hollingshead 01/22/2017, 9:34 AM

## 2017-02-02 DIAGNOSIS — H40013 Open angle with borderline findings, low risk, bilateral: Secondary | ICD-10-CM | POA: Diagnosis not present

## 2017-04-23 DIAGNOSIS — Z23 Encounter for immunization: Secondary | ICD-10-CM | POA: Diagnosis not present

## 2017-04-23 DIAGNOSIS — I10 Essential (primary) hypertension: Secondary | ICD-10-CM | POA: Diagnosis not present

## 2017-04-23 DIAGNOSIS — R350 Frequency of micturition: Secondary | ICD-10-CM | POA: Diagnosis not present

## 2017-05-06 IMAGING — MR MR ABDOMEN WO/W CM
12 of 17 series · 28 of 48 positions shown · IV contrast (14ml Multihance)
Comparison: 03/16/2016

CLINICAL DATA: Followup liver lesions seen on prior MRI.

EXAM:
MRI ABDOMEN WITHOUT AND WITH CONTRAST
TECHNIQUE: Multiplanar multisequence MR imaging of the abdomen was performed
both before and after the administration of intravenous contrast.
CONTRAST:  14mL MULTIHANCE GADOBENATE DIMEGLUMINE 529 MG/ML IV SOLN

[Series 3: T2 · coronal · 5.0mm · 1.56mm/px · 1 of 23 slices shown (1 of 3)]
[im 1/23]
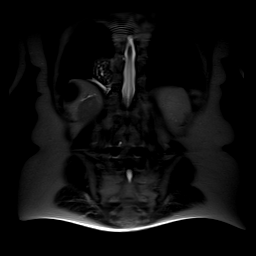

[Series 4: axial tru fisp · axial · 4.0mm · 1.48mm/px · 1 of 30 slices shown]
[im 1/30]
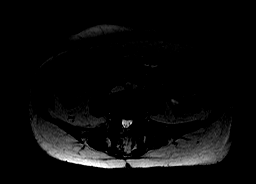

[Series 5: T2 · axial · 4.9mm · 1.37mm/px · 1 of 30 slices shown (2 of 3)]
[im 1/30]
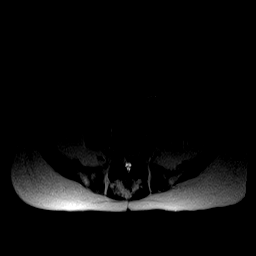

[Series 6: T2 · axial · 6.5mm · 0.74mm/px · 1 of 24 slices shown (3 of 3)]
[im 1/24]
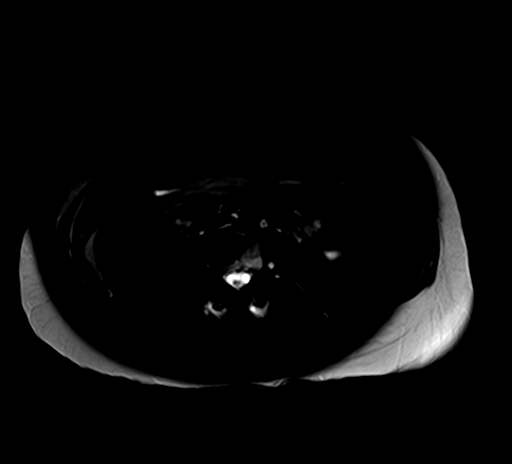

[Series 7: ep2d_diff_b50_500_800_p2 · axial · 6.0mm · 1.98mm/px · z∈[+27,+168]mm · 2 of 57 slices shown]
[im 1/57]
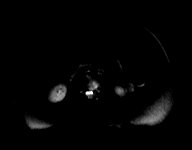
[im 57/57]
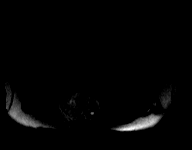

[Series 8: ep2d_[id number]_500_800_p2_adc · axial · 6.0mm · 1.98mm/px · 1 of 19 slices shown]
[im 1/19]
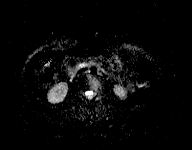

[Series 9: axial in out · axial · 6.0mm · 0.74mm/px · z∈[-22,+151]mm · 2 of 52 slices shown]
[im 1/52]
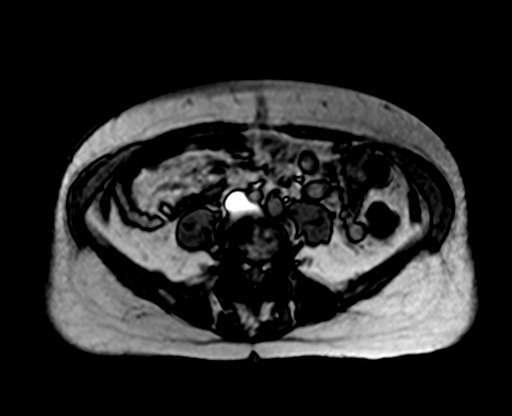
[im 52/52]
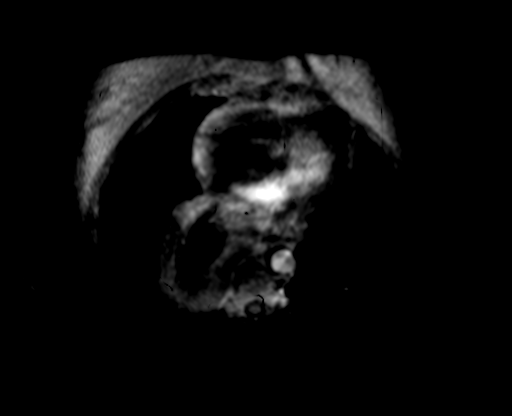

[Series 10: T1 dynamic · axial · non-contrast · 2.0mm · 0.78mm/px · z∈[-17,+157]mm · 4 of 88 slices shown]
[im 1/88]
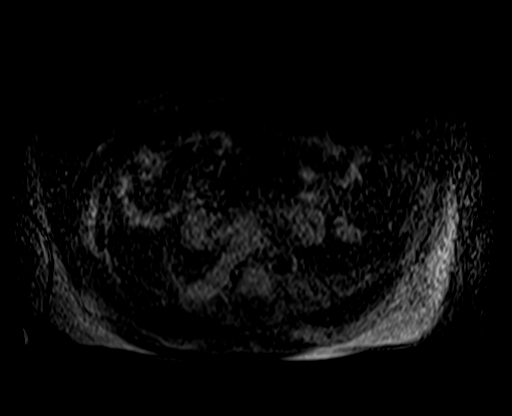
[im 30/88]
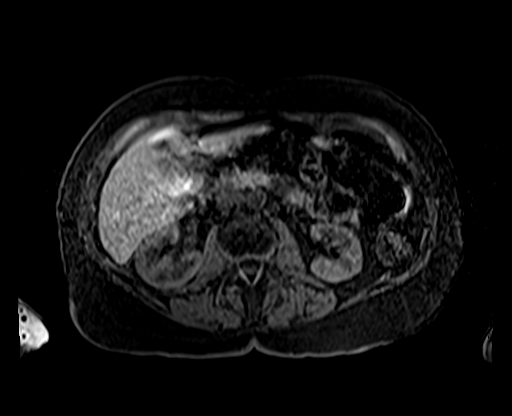
[im 59/88]
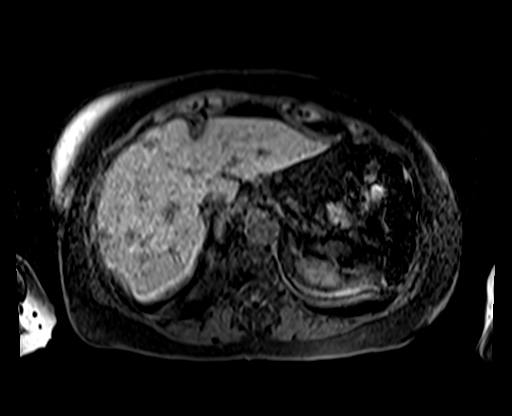
[im 88/88]
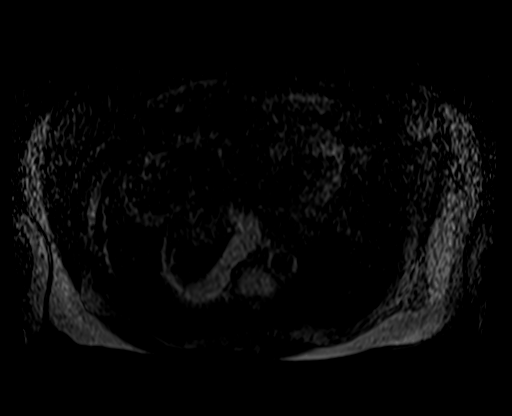

[Series 11: post 25 sec · axial · 2.0mm · 0.78mm/px · z∈[-17,+157]mm · 4 of 88 slices shown]
[im 1/88]
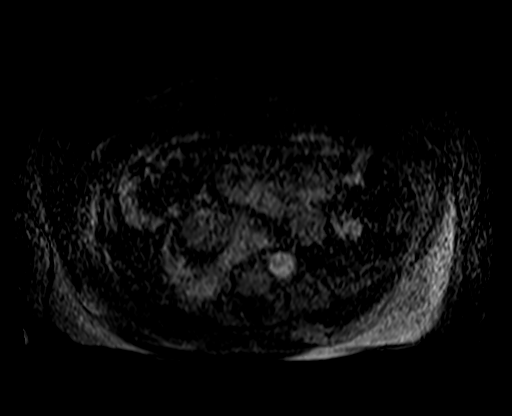
[im 30/88]
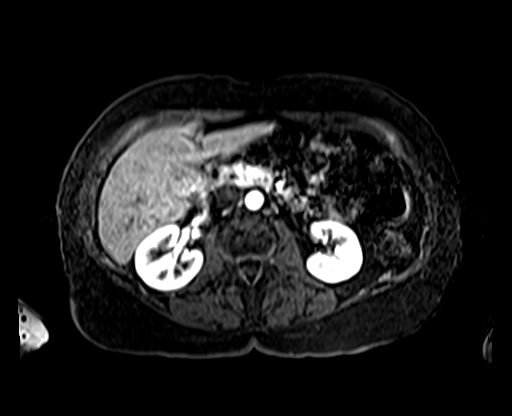
[im 59/88]
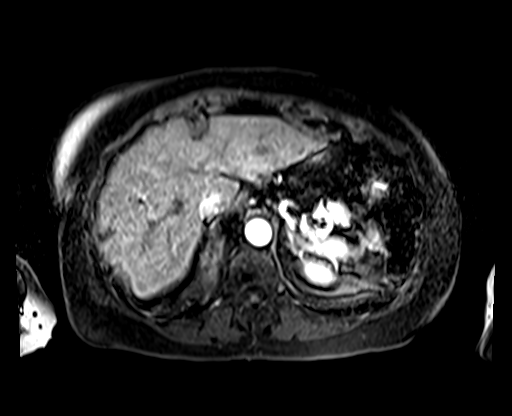
[im 88/88]
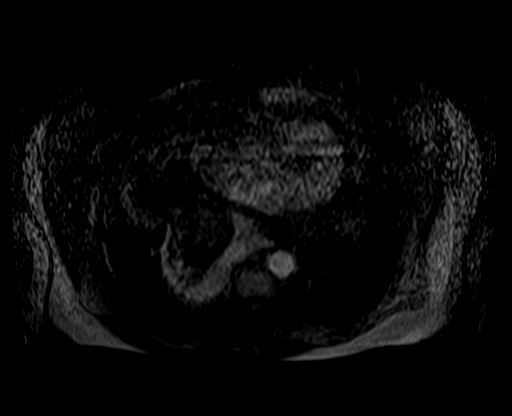

[Series 12: post 25 sec_sub · axial · 2.0mm · 0.78mm/px · z∈[-17,+157]mm · 4 of 88 slices shown]
[im 1/88]
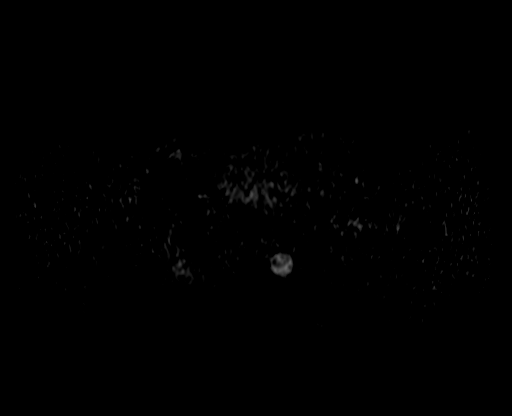
[im 30/88]
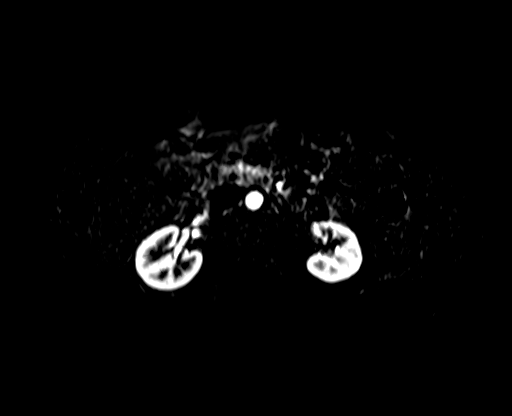
[im 59/88]
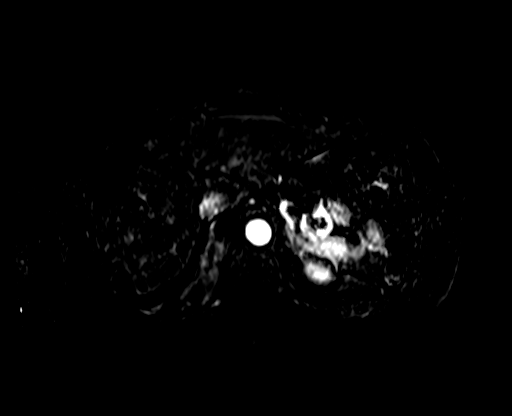
[im 88/88]
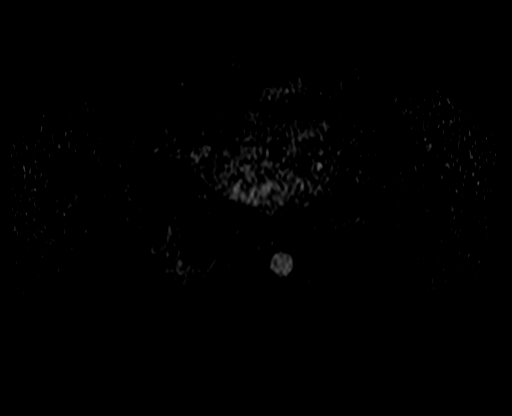

[Series 13: post 45 sec · axial · 2.0mm · 0.78mm/px · z∈[-17,+157]mm · 4 of 88 slices shown]
[im 1/88]
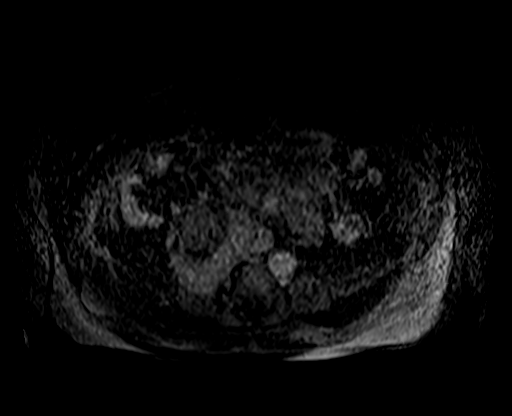
[im 30/88]
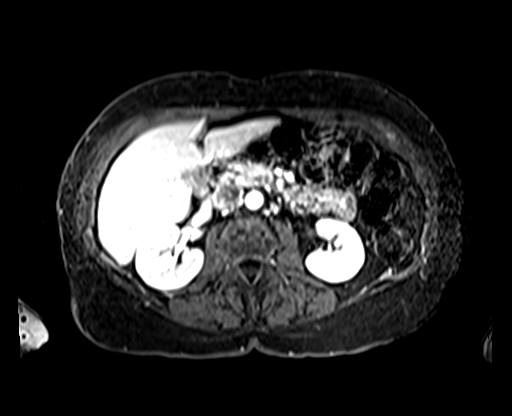
[im 59/88]
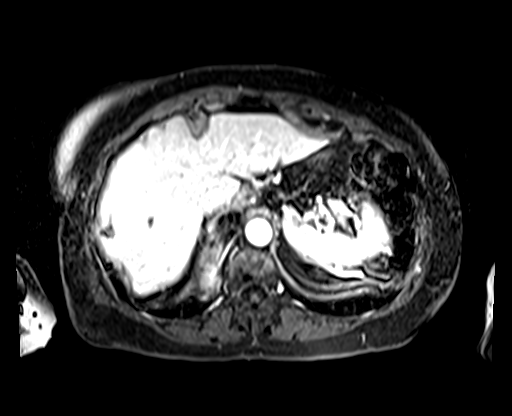
[im 88/88]
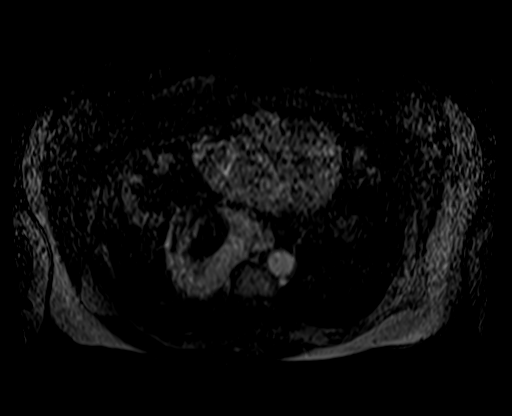

[Series 14: post 45 sec_sub · axial · 2.0mm · 0.78mm/px · z∈[-17,+99]mm · 3 of 88 slices shown]
[im 1/88]
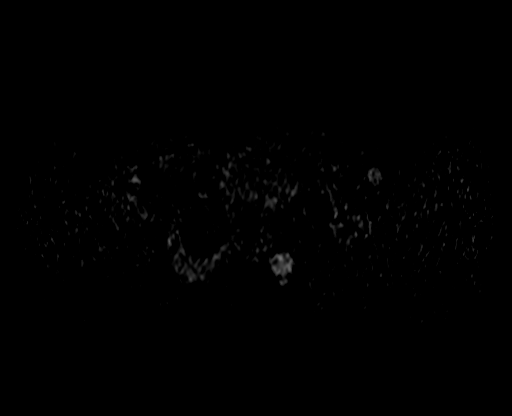
[im 30/88]
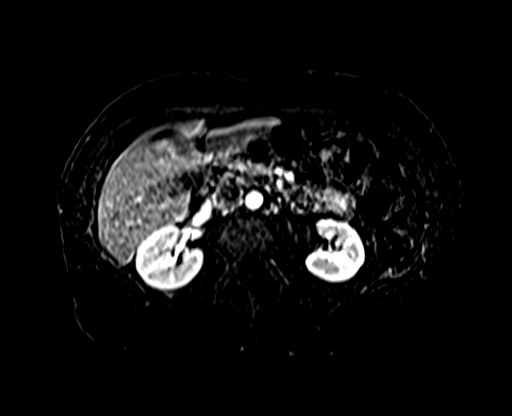
[im 59/88]
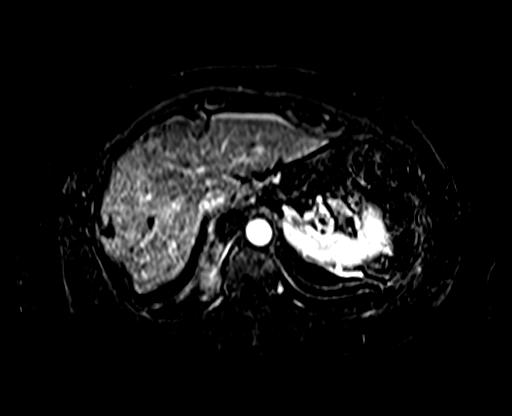

[28 of 48 positions shown; findings below may reference images not displayed]

FINDINGS: Lower chest: The lung bases are clear. No worrisome pulmonary
lesions. No pleural or pericardial effusion. There is a large hiatal
hernia again demonstrated. Most of the stomach is up in the chest.

Hepatobiliary: Numerous stable simple and septated cysts noted in
both lobes of the liver. No worrisome hepatic lesions or
intrahepatic biliary dilatation. The gallbladder is normal. No
common bile duct dilatation.

Pancreas:  No mass, inflammation or ductal dilatation.

Spleen:  Normal size.  No focal lesions.

Adrenals/Urinary Tract: The adrenal glands and kidneys are
unremarkable. No worrisome renal lesions or hydronephrosis.

Stomach/Bowel: The stomach, duodenum, visualized small bowel and
visualized colon are unremarkable.

Vascular/Lymphatic: No pathologically enlarged lymph nodes
identified. No abdominal aortic aneurysm demonstrated.

Other:  No ascites or abdominal wall hernia.

Musculoskeletal: No significant bony findings.
IMPRESSION: Stable benign hepatic cysts.  No worrisome hepatic lesions.

Stable large hiatal hernia with most of the stomach up in the chest.

## 2017-05-10 DIAGNOSIS — R12 Heartburn: Secondary | ICD-10-CM | POA: Diagnosis not present

## 2017-05-10 DIAGNOSIS — Z9889 Other specified postprocedural states: Secondary | ICD-10-CM | POA: Diagnosis not present

## 2017-05-10 DIAGNOSIS — K2270 Barrett's esophagus without dysplasia: Secondary | ICD-10-CM | POA: Diagnosis not present

## 2017-05-10 DIAGNOSIS — Z09 Encounter for follow-up examination after completed treatment for conditions other than malignant neoplasm: Secondary | ICD-10-CM | POA: Diagnosis not present

## 2017-06-21 DIAGNOSIS — Z8639 Personal history of other endocrine, nutritional and metabolic disease: Secondary | ICD-10-CM | POA: Diagnosis not present

## 2017-06-21 DIAGNOSIS — R011 Cardiac murmur, unspecified: Secondary | ICD-10-CM | POA: Diagnosis not present

## 2017-06-21 DIAGNOSIS — E119 Type 2 diabetes mellitus without complications: Secondary | ICD-10-CM | POA: Diagnosis not present

## 2017-06-21 DIAGNOSIS — K219 Gastro-esophageal reflux disease without esophagitis: Secondary | ICD-10-CM | POA: Diagnosis not present

## 2017-06-21 DIAGNOSIS — I10 Essential (primary) hypertension: Secondary | ICD-10-CM | POA: Diagnosis not present

## 2017-06-21 DIAGNOSIS — Z23 Encounter for immunization: Secondary | ICD-10-CM | POA: Diagnosis not present

## 2017-06-21 DIAGNOSIS — D649 Anemia, unspecified: Secondary | ICD-10-CM | POA: Diagnosis not present

## 2017-06-21 DIAGNOSIS — K2270 Barrett's esophagus without dysplasia: Secondary | ICD-10-CM | POA: Diagnosis not present

## 2017-06-21 DIAGNOSIS — E559 Vitamin D deficiency, unspecified: Secondary | ICD-10-CM | POA: Diagnosis not present

## 2017-06-21 DIAGNOSIS — M810 Age-related osteoporosis without current pathological fracture: Secondary | ICD-10-CM | POA: Diagnosis not present

## 2017-06-21 DIAGNOSIS — Z0000 Encounter for general adult medical examination without abnormal findings: Secondary | ICD-10-CM | POA: Diagnosis not present

## 2017-06-21 DIAGNOSIS — E7800 Pure hypercholesterolemia, unspecified: Secondary | ICD-10-CM | POA: Diagnosis not present

## 2017-06-23 ENCOUNTER — Other Ambulatory Visit (HOSPITAL_COMMUNITY): Payer: Self-pay | Admitting: Family Medicine

## 2017-06-23 DIAGNOSIS — R011 Cardiac murmur, unspecified: Secondary | ICD-10-CM

## 2017-06-25 DIAGNOSIS — Z1231 Encounter for screening mammogram for malignant neoplasm of breast: Secondary | ICD-10-CM | POA: Diagnosis not present

## 2017-07-02 ENCOUNTER — Ambulatory Visit (HOSPITAL_COMMUNITY)
Admission: RE | Admit: 2017-07-02 | Discharge: 2017-07-02 | Disposition: A | Discharge status: Home / Self Care | Payer: Medicare Other | Source: Ambulatory Visit | Attending: Internal Medicine | Admitting: Internal Medicine

## 2017-07-02 DIAGNOSIS — I080 Rheumatic disorders of both mitral and aortic valves: Secondary | ICD-10-CM | POA: Diagnosis not present

## 2017-07-02 DIAGNOSIS — I10 Essential (primary) hypertension: Secondary | ICD-10-CM | POA: Diagnosis not present

## 2017-07-02 DIAGNOSIS — K219 Gastro-esophageal reflux disease without esophagitis: Secondary | ICD-10-CM | POA: Insufficient documentation

## 2017-07-02 DIAGNOSIS — R011 Cardiac murmur, unspecified: Secondary | ICD-10-CM | POA: Insufficient documentation

## 2017-07-02 NOTE — Progress Notes (Signed)
  Echocardiogram 2D Echocardiogram has been performed.  Matilde Bash 07/02/2017, 10:57 AM

## 2017-07-16 ENCOUNTER — Ambulatory Visit (INDEPENDENT_AMBULATORY_CARE_PROVIDER_SITE_OTHER): Payer: Medicare Other | Admitting: Podiatry

## 2017-07-16 DIAGNOSIS — B351 Tinea unguium: Secondary | ICD-10-CM | POA: Diagnosis not present

## 2017-07-16 MED ORDER — TERBINAFINE HCL 250 MG PO TABS: ORAL_TABLET | ORAL | 0 refills | Status: DC

## 2017-07-16 NOTE — Progress Notes (Signed)
Subjective:   Patient ID: Sheila Rice, female   DOB: 72 y.o.   MRN: [id number]   HPI Patient presents with nail disease of the big toe right fifth nail bilateral and third left with thickness and states laser did well from her previously and she wants to get   ROS      Objective:  Physical Exam  Neurovascular status intact with patient noted to have thick nailbeds with also what appears to be traumatized the right hallux nail     Assessment:  Mycotic nail infection hallux right third left and several other nails that are thick yellow with obvious signs of fungal infiltration     Plan:  Reviewed condition discussed condition at this point recommended laser along with a topical treatment formula 7 and pulse Lamisil therapy. Patient will have 6 laser treatments done and is scheduled to start this next week

## 2017-07-23 ENCOUNTER — Ambulatory Visit: Payer: Self-pay | Admitting: Podiatry

## 2017-07-23 DIAGNOSIS — B351 Tinea unguium: Secondary | ICD-10-CM

## 2017-07-26 NOTE — Progress Notes (Signed)
Pt presents with mycotic infection of nails 1-5 bilateral  All other systems are negative  Laser therapy administered to affected nails and tolerated well. All safety precautions were in place. RE-appointed in 4 weeks for 2nd treatment 

## 2017-08-03 DIAGNOSIS — H40013 Open angle with borderline findings, low risk, bilateral: Secondary | ICD-10-CM | POA: Diagnosis not present

## 2017-08-06 ENCOUNTER — Other Ambulatory Visit: Payer: Self-pay | Admitting: Gastroenterology

## 2017-08-06 DIAGNOSIS — K449 Diaphragmatic hernia without obstruction or gangrene: Secondary | ICD-10-CM | POA: Diagnosis not present

## 2017-08-06 DIAGNOSIS — K2270 Barrett's esophagus without dysplasia: Secondary | ICD-10-CM | POA: Diagnosis not present

## 2017-08-06 DIAGNOSIS — Z86010 Personal history of colonic polyps: Secondary | ICD-10-CM | POA: Diagnosis not present

## 2017-08-13 ENCOUNTER — Ambulatory Visit
Admission: RE | Admit: 2017-08-13 | Discharge: 2017-08-13 | Disposition: A | Discharge status: Home / Self Care | Payer: Medicare Other | Source: Ambulatory Visit | Attending: Gastroenterology | Admitting: Gastroenterology

## 2017-08-13 DIAGNOSIS — K449 Diaphragmatic hernia without obstruction or gangrene: Secondary | ICD-10-CM | POA: Diagnosis not present

## 2017-08-20 ENCOUNTER — Ambulatory Visit (INDEPENDENT_AMBULATORY_CARE_PROVIDER_SITE_OTHER): Payer: Medicare Other

## 2017-08-20 DIAGNOSIS — B351 Tinea unguium: Secondary | ICD-10-CM

## 2017-08-20 NOTE — Progress Notes (Signed)
Pt presents with mycotic infection of nails 1-5 bilateral  All other systems are negative  Laser therapy administered to affected nails and tolerated well. All safety precautions were in place. RE-appointed in 4 weeks for 3rd treatment 

## 2017-09-06 DIAGNOSIS — K449 Diaphragmatic hernia without obstruction or gangrene: Secondary | ICD-10-CM | POA: Diagnosis not present

## 2017-09-06 DIAGNOSIS — K219 Gastro-esophageal reflux disease without esophagitis: Secondary | ICD-10-CM | POA: Diagnosis not present

## 2017-09-17 ENCOUNTER — Ambulatory Visit (INDEPENDENT_AMBULATORY_CARE_PROVIDER_SITE_OTHER): Payer: Self-pay

## 2017-09-17 DIAGNOSIS — B351 Tinea unguium: Secondary | ICD-10-CM

## 2017-09-20 NOTE — Progress Notes (Signed)
Pt presents with mycotic infection of nails 1-5 bilateral  All other systems are negative  Laser therapy administered to affected nails and tolerated well. All safety precautions were in place. RE-appointed in 4 weeks for 4th treatment 

## 2017-09-30 ENCOUNTER — Emergency Department (HOSPITAL_BASED_OUTPATIENT_CLINIC_OR_DEPARTMENT_OTHER): Payer: Medicare Other

## 2017-09-30 ENCOUNTER — Encounter (HOSPITAL_BASED_OUTPATIENT_CLINIC_OR_DEPARTMENT_OTHER): Payer: Self-pay | Admitting: *Deleted

## 2017-09-30 ENCOUNTER — Other Ambulatory Visit: Payer: Self-pay

## 2017-09-30 ENCOUNTER — Emergency Department (HOSPITAL_BASED_OUTPATIENT_CLINIC_OR_DEPARTMENT_OTHER)
Admission: EM | Admit: 2017-09-30 | Discharge: 2017-09-30 | Disposition: A | Discharge status: Home / Self Care | Payer: Medicare Other | Attending: Emergency Medicine | Admitting: Emergency Medicine

## 2017-09-30 DIAGNOSIS — I10 Essential (primary) hypertension: Secondary | ICD-10-CM | POA: Diagnosis not present

## 2017-09-30 DIAGNOSIS — R52 Pain, unspecified: Secondary | ICD-10-CM

## 2017-09-30 DIAGNOSIS — M25511 Pain in right shoulder: Secondary | ICD-10-CM

## 2017-09-30 DIAGNOSIS — Z79899 Other long term (current) drug therapy: Secondary | ICD-10-CM | POA: Insufficient documentation

## 2017-09-30 DIAGNOSIS — M546 Pain in thoracic spine: Secondary | ICD-10-CM | POA: Diagnosis not present

## 2017-09-30 MED ORDER — LIDOCAINE 5 % EX PTCH: 1.0000 | MEDICATED_PATCH | CUTANEOUS | 0 refills | Status: DC

## 2017-09-30 MED ORDER — ACETAMINOPHEN 325 MG PO TABS
650.0000 mg | ORAL_TABLET | Freq: Once | ORAL | Status: AC
Start: 2017-09-30 — End: 2017-09-30
  Administered 2017-09-30: 650 mg via ORAL
  Filled 2017-09-30: qty 2

## 2017-09-30 MED ORDER — METHOCARBAMOL 500 MG PO TABS
500.0000 mg | ORAL_TABLET | Freq: Once | ORAL | Status: AC
  Administered 2017-09-30: 500 mg via ORAL
  Filled 2017-09-30: qty 1

## 2017-09-30 MED ORDER — HYDROCODONE-ACETAMINOPHEN 5-325 MG PO TABS: 1.0000 | ORAL_TABLET | Freq: Three times a day (TID) | ORAL | 0 refills | Status: DC | PRN

## 2017-09-30 MED ORDER — POLYETHYLENE GLYCOL 3350 17 G PO PACK: 17.0000 g | PACK | Freq: Every day | ORAL | 0 refills | Status: DC

## 2017-09-30 NOTE — Discharge Instructions (Addendum)
The x-ray of your shoulder showed some arthritis to the joint, but there was no fracture or dislocation.  You were given a prescription for Lidoderm patch which you may use to the affected areas.  You are also given a prescription for Norco which he can take every 8 hours for your pain.  You should not drive, work, or operate machinery while you are taking this medication as it can make you very sleepy.  You should not take this medication with muscle relaxers.  This medication can make you constipated so you should take miralax daily while you are taking this.  You should keep your appointment with your orthopedic doctor in 1 week.  You should return to the emergency department for any new or worsening symptoms.  Return for any chest pain or shortness of breath.

## 2017-09-30 NOTE — ED Triage Notes (Signed)
Pt has been having severe pain in her right shoulder for a week. The pain is increasing.

## 2017-09-30 NOTE — ED Provider Notes (Signed)
[facility] Provider Note   CSN: [id number] Arrival date & time: 09/30/17  1555     History   Chief Complaint Chief Complaint  Patient presents with  . Shoulder Pain    HPI ELLOWYN RIEVES is a 72 y.o. female.  HPI   Patient is a is a 72 year old female history of hypertension is presenting to ED complaining of right shoulder pain that began 1 week ago.  Describes pain as a burning ache.  Rates pain 10/10.  Denies any known injuries or trauma.  She also has right-sided mid back pain that started several days ago.  She has tried oxycodone, hydrocodone, ibuprofen, muscle relaxers with no relief.  States she has had decreased sleep due to pain.  No recent falls or trauma.  No chest pain or shortness of breath.  No abdominal pain, nausea, vomiting, diarrhea, diaphoresis, numbness, weakness to her arms or legs.  No headaches, no fevers.  Past Medical History:  Diagnosis Date  . GERD (gastroesophageal reflux disease)   . Hypertension     Patient Active Problem List   Diagnosis Date Noted  . Hypertension   . GERD (gastroesophageal reflux disease)   . Hiatal hernia s/p lap repair & Niseen fundoplication 3/97/6734 19/37/9024    Past Surgical History:  Procedure Laterality Date  . BACK SURGERY     Spinal Stenosis  . BUNIONECTOMY    . LAPAROSCOPIC NISSEN FUNDOPLICATION N/A 0/97/3532   Procedure: LAPAROSCOPIC NISSEN FUNDOPLICATION and HIATAL HERNIA REPAIR;  Surgeon: Jackolyn Confer, MD;  Location: [facility];  Service: General;  Laterality: N/A;     OB History   None      Home Medications    Prior to Admission medications   Medication Sig Start Date End Date Taking? Authorizing Provider  atorvastatin (LIPITOR) 10 MG tablet Take 10 mg by mouth daily.   Yes [provider]  CALCIUM CITRATE PO Take 1,600 mg by mouth daily. Takes (2) 800 mg tablets daily   Yes [provider]  esomeprazole (NEXIUM) 40 MG capsule Take 40 mg by  mouth daily.   Yes [provider]  hydrochlorothiazide (HYDRODIURIL) 12.5 MG tablet Take 12.5 mg by mouth daily.   Yes [provider]  Lifitegrast Shirley Friar) 5 % SOLN Place 1 drop into both eyes 2 (two) times daily.   Yes [provider]  losartan (COZAAR) 100 MG tablet Take 100 mg by mouth daily.   Yes [provider]  metoCLOPramide (REGLAN) 10 MG tablet Take 10 mg by mouth daily.   Yes [provider]  oxyCODONE (OXY IR/ROXICODONE) 5 MG immediate release tablet Take 1 tablet (5 mg total) by mouth every 4 (four) hours as needed for moderate pain, severe pain or breakthrough pain. 01/18/17  Yes Jackolyn Confer, MD  terbinafine (LAMISIL) 250 MG tablet 1 week a month for 4 months 11/01/14  Yes Regal, Tamala Fothergill, DPM  terbinafine (LAMISIL) 250 MG tablet Take one tablet once daily x 7 days, then repeat every month x 4 months 02/15/15  Yes Regal, Tamala Fothergill, DPM  terbinafine (LAMISIL) 250 MG tablet Please take one a day x 7days, repeat every 4 weeks x 4 months 07/16/17  Yes Regal, Tamala Fothergill, DPM  lidocaine (LIDODERM) 5 % Place 1 patch onto the skin daily. Remove & Discard patch within 12 hours or as directed by MD 09/30/17   Maybelline Kolarik S, PA-C  ondansetron (ZOFRAN) 4 MG tablet Take 1 tablet (4 mg total) by mouth  every 4 (four) hours as needed for nausea or vomiting. 01/18/17   Jackolyn Confer, MD    Family History History reviewed. No pertinent family history.  Social History Social History   Tobacco Use  . Smoking status: Never Smoker  . Smokeless tobacco: Never Used  Substance Use Topics  . Alcohol use: Yes    Comment: Occa  . Drug use: No     Allergies   Ace inhibitors   Review of Systems Review of Systems  Constitutional: Negative for diaphoresis and fever.  HENT: Negative for ear pain.   Eyes: Negative for visual disturbance.  Respiratory: Negative for shortness of breath.   Cardiovascular: Negative for chest pain.  Gastrointestinal:  Negative for abdominal pain, diarrhea, nausea and vomiting.  Genitourinary: Negative for decreased urine volume, dysuria, frequency, hematuria, pelvic pain and urgency.  Musculoskeletal: Positive for back pain.       Right shoulder pain  Neurological: Negative for dizziness, weakness, light-headedness, numbness and headaches.     Physical Exam Updated Vital Signs BP (!) 176/89 (BP Location: Right Arm)   Pulse 86   Temp 98.6 F (37 C) (Oral)   Resp 16   Ht 5' (1.524 m) Comment: Simultaneous filing. User may not have seen previous data.  Wt 58.7 kg (129 lb 4.8 oz) Comment: Simultaneous filing. User may not have seen previous data.  SpO2 98%   BMI 25.25 kg/m   Physical Exam  Constitutional: She appears well-developed and well-nourished. No distress.  HENT:  Head: Normocephalic and atraumatic.  Eyes: Pupils are equal, round, and reactive to light. Conjunctivae and EOM are normal.  Neck: Normal range of motion. Neck supple.  Cardiovascular: Normal rate, regular rhythm, normal heart sounds and intact distal pulses.  No murmur heard. Radial and dp Pulses 2+ and symmetric bilat  Pulmonary/Chest: Effort normal and breath sounds normal. No respiratory distress. She has no wheezes.  Abdominal: Soft. Bowel sounds are normal. There is no tenderness.  No cva ttp  Musculoskeletal: She exhibits no edema.  No TTP to the cervical, thoracic, or lumbar spine. TTP to mid thoracic paraspinous muscles. ttp to right trapezius muscle the reproduces pain. No ttp to clavicle, GH joint, or along biceps tendon. Negative crossover test. Negative neers test. FROM at bilat shoulders with strong and equal strength  Neurological: She is alert.  5/5 strength to BUE and BLE. Sensation intact and symmetric bilat  Skin: Skin is warm and dry. Capillary refill takes less than 2 seconds.  Psychiatric: She has a normal mood and affect.  Nursing note and vitals reviewed.    ED Treatments / Results  Labs (all labs  ordered are listed, but only abnormal results are displayed) Labs Reviewed - No data to display  EKG None  Radiology Dg Shoulder Right  Result Date: 09/30/2017 CLINICAL DATA:  Pain EXAM: RIGHT SHOULDER - 2+ VIEW COMPARISON:  None. FINDINGS: Oblique, Y scapular, and axillary views were obtained. No fracture or dislocation. There is narrowing of the acromioclavicular joint. Glenohumeral joint appears unremarkable. No erosive change. Visualized right lung clear. IMPRESSION: No fracture or dislocation. Osteoarthritic change in acromioclavicular joint. Electronically Signed   By: Lowella Grip III M.D.   On: 09/30/2017 17:41    Procedures Procedures (including critical care time)  Medications Ordered in ED Medications  acetaminophen (TYLENOL) tablet 650 mg (650 mg Oral Given 09/30/17 1709)  methocarbamol (ROBAXIN) tablet 500 mg (500 mg Oral Given 09/30/17 1709)     Initial Impression / Assessment and Plan /  ED Course  I have reviewed the triage vital signs and the nursing notes.  Pertinent labs & imaging results that were available during my care of the patient were reviewed by me and considered in my medical decision making (see chart for details).     Discussed pt presentation and exam findings with Dr. Laverta Baltimore, who evaluated the patient agrees with plan for discharge.   Final Clinical Impressions(s) / ED Diagnoses   Final diagnoses:  Acute pain of right shoulder   73 year old female presenting with right shoulder pain for 1 week.  Mildly hypertensive, consistent with patient's baseline according to prior vital signs from prior visits.  Otherwise vital signs stable.  Patient in no acute distress.  Nontoxic-appearing.  Tenderness to right trapezius muscles and right paraspinous muscles in the midthoracic back.  X-ray negative for acute fracture or dislocation, did show arthritis in the right shoulder.  Do not suspect septic joint.  Patient has no chest pain or shortness of breath.   Doubt hypertensive emergency/urgency has no evidence of endorgan damage.  Patient given muscle relaxer and Tylenol in the ED.  Will be discharged with lidocaine patches and advised follow-up with your orthopedist as already scheduled.  Advised to return to emergency department for any new or worsening symptoms.  ED Discharge Orders        Ordered    lidocaine (LIDODERM) 5 %  Every 24 hours     09/30/17 1817       Bishop Dublin 09/30/17 1820    Margette Fast, MD 10/01/17 1232

## 2017-10-07 DIAGNOSIS — M545 Low back pain: Secondary | ICD-10-CM | POA: Diagnosis not present

## 2017-10-07 DIAGNOSIS — M48061 Spinal stenosis, lumbar region without neurogenic claudication: Secondary | ICD-10-CM | POA: Diagnosis not present

## 2017-10-07 DIAGNOSIS — M4310 Spondylolisthesis, site unspecified: Secondary | ICD-10-CM | POA: Diagnosis not present

## 2017-10-07 DIAGNOSIS — M5136 Other intervertebral disc degeneration, lumbar region: Secondary | ICD-10-CM | POA: Diagnosis not present

## 2017-10-15 DIAGNOSIS — M545 Low back pain: Secondary | ICD-10-CM | POA: Diagnosis not present

## 2017-10-18 ENCOUNTER — Ambulatory Visit (INDEPENDENT_AMBULATORY_CARE_PROVIDER_SITE_OTHER): Payer: Medicare Other

## 2017-10-18 DIAGNOSIS — B351 Tinea unguium: Secondary | ICD-10-CM

## 2017-10-21 DIAGNOSIS — M545 Low back pain, unspecified: Secondary | ICD-10-CM | POA: Insufficient documentation

## 2017-10-21 DIAGNOSIS — M810 Age-related osteoporosis without current pathological fracture: Secondary | ICD-10-CM | POA: Diagnosis not present

## 2017-10-21 DIAGNOSIS — M48061 Spinal stenosis, lumbar region without neurogenic claudication: Secondary | ICD-10-CM | POA: Insufficient documentation

## 2017-10-21 DIAGNOSIS — M5136 Other intervertebral disc degeneration, lumbar region: Secondary | ICD-10-CM | POA: Diagnosis not present

## 2017-10-22 DIAGNOSIS — M5136 Other intervertebral disc degeneration, lumbar region: Secondary | ICD-10-CM | POA: Insufficient documentation

## 2017-10-22 DIAGNOSIS — M51369 Other intervertebral disc degeneration, lumbar region without mention of lumbar back pain or lower extremity pain: Secondary | ICD-10-CM | POA: Insufficient documentation

## 2017-10-22 DIAGNOSIS — M810 Age-related osteoporosis without current pathological fracture: Secondary | ICD-10-CM | POA: Insufficient documentation

## 2017-10-25 NOTE — Progress Notes (Signed)
Pt presents with mycotic infection of nails 1-5 bilateral  All other systems are negative  Laser therapy administered to affected nails and tolerated well. All safety precautions were in place. RE-appointed in 4 weeks for 5th treatment 

## 2017-11-08 DIAGNOSIS — E559 Vitamin D deficiency, unspecified: Secondary | ICD-10-CM | POA: Diagnosis not present

## 2017-11-08 DIAGNOSIS — M810 Age-related osteoporosis without current pathological fracture: Secondary | ICD-10-CM | POA: Diagnosis not present

## 2017-11-08 DIAGNOSIS — E7800 Pure hypercholesterolemia, unspecified: Secondary | ICD-10-CM | POA: Diagnosis not present

## 2017-11-12 DIAGNOSIS — Z86010 Personal history of colonic polyps: Secondary | ICD-10-CM | POA: Diagnosis not present

## 2017-11-12 DIAGNOSIS — K219 Gastro-esophageal reflux disease without esophagitis: Secondary | ICD-10-CM | POA: Diagnosis not present

## 2017-12-14 DIAGNOSIS — H40013 Open angle with borderline findings, low risk, bilateral: Secondary | ICD-10-CM | POA: Diagnosis not present

## 2017-12-30 DIAGNOSIS — H40013 Open angle with borderline findings, low risk, bilateral: Secondary | ICD-10-CM | POA: Diagnosis not present

## 2018-01-14 ENCOUNTER — Ambulatory Visit (INDEPENDENT_AMBULATORY_CARE_PROVIDER_SITE_OTHER): Payer: Medicare Other

## 2018-01-14 ENCOUNTER — Encounter: Payer: Self-pay | Admitting: Podiatry

## 2018-01-14 ENCOUNTER — Ambulatory Visit (INDEPENDENT_AMBULATORY_CARE_PROVIDER_SITE_OTHER): Payer: Medicare Other | Admitting: Podiatry

## 2018-01-14 DIAGNOSIS — M7751 Other enthesopathy of right foot: Secondary | ICD-10-CM

## 2018-01-14 DIAGNOSIS — M779 Enthesopathy, unspecified: Secondary | ICD-10-CM | POA: Diagnosis not present

## 2018-01-14 DIAGNOSIS — M7661 Achilles tendinitis, right leg: Secondary | ICD-10-CM | POA: Diagnosis not present

## 2018-01-14 MED ORDER — TRIAMCINOLONE ACETONIDE 10 MG/ML IJ SUSP
10.0000 mg | Freq: Once | INTRAMUSCULAR | Status: AC
  Administered 2018-01-14: 10 mg

## 2018-01-14 NOTE — Progress Notes (Signed)
Subjective:   Patient ID: Sheila Rice, female   DOB: 72 y.o.   MRN: [id number]   HPI Patient states she is developed a lump on the back of the right heel which is been present for around 3 months and is been quite painful when she tries to be active   ROS      Objective:  Physical Exam  Neurovascular status intact negative Homans sign noted with patient's right posterior Achilles being quite tender on the lateral side with inflammation fluid buildup noted     Assessment:  Inflammatory tendinitis at the insertion of the Achilles into the back of the heel lateral side     Plan:  H&P x-ray reviewed and discussed careful injection therapy explaining the procedure and risk.  Patient wants to do this understanding chances for rupture associated with injection treatment I did a careful injection after sterile prep of the right lateral insertion of the Achilles into the calcaneus 3 mg dexamethasone Kenalog 5 mg Xylocaine advised on reduced activity and reappoint for [place] to recheck again and may require physical therapy or other treatments new  X-rays indicate that there is minimal spur formation with no indications of other pathology posterior right heel

## 2018-01-14 NOTE — Patient Instructions (Signed)

## 2018-02-07 DIAGNOSIS — K2270 Barrett's esophagus without dysplasia: Secondary | ICD-10-CM | POA: Diagnosis not present

## 2018-02-07 DIAGNOSIS — Z86010 Personal history of colonic polyps: Secondary | ICD-10-CM | POA: Diagnosis not present

## 2018-04-04 ENCOUNTER — Other Ambulatory Visit: Payer: Self-pay

## 2018-04-04 ENCOUNTER — Emergency Department (HOSPITAL_BASED_OUTPATIENT_CLINIC_OR_DEPARTMENT_OTHER): Payer: Medicare Other

## 2018-04-04 ENCOUNTER — Encounter (HOSPITAL_BASED_OUTPATIENT_CLINIC_OR_DEPARTMENT_OTHER): Payer: Self-pay | Admitting: Emergency Medicine

## 2018-04-04 ENCOUNTER — Emergency Department (HOSPITAL_BASED_OUTPATIENT_CLINIC_OR_DEPARTMENT_OTHER)
Admission: EM | Admit: 2018-04-04 | Discharge: 2018-04-04 | Disposition: A | Discharge status: Home / Self Care | Payer: Medicare Other | Attending: Emergency Medicine | Admitting: Emergency Medicine

## 2018-04-04 DIAGNOSIS — M546 Pain in thoracic spine: Secondary | ICD-10-CM

## 2018-04-04 DIAGNOSIS — W01198A Fall on same level from slipping, tripping and stumbling with subsequent striking against other object, initial encounter: Secondary | ICD-10-CM | POA: Diagnosis not present

## 2018-04-04 DIAGNOSIS — I10 Essential (primary) hypertension: Secondary | ICD-10-CM | POA: Diagnosis not present

## 2018-04-04 DIAGNOSIS — S299XXA Unspecified injury of thorax, initial encounter: Secondary | ICD-10-CM | POA: Diagnosis not present

## 2018-04-04 MED ORDER — CYCLOBENZAPRINE HCL 10 MG PO TABS: 10.0000 mg | ORAL_TABLET | Freq: Two times a day (BID) | ORAL | 0 refills | Status: DC | PRN

## 2018-04-04 MED FILL — CYCLOBENZAPRINE HCL 10 MG T: 10 | 10 days supply | Qty: 20 | Fill #0

## 2018-04-04 NOTE — ED Triage Notes (Signed)
Pt report fall yesterday at home, was lying down , felt dizzy getting up, fell . No loc, no obvious injury. Hx HTN. No weakness to the extremities nor vision changes. Alert and oriented x 4. Reports right side  pain .

## 2018-04-04 NOTE — Discharge Instructions (Addendum)
You were evaluated in the Emergency Department and after careful evaluation, we did not find any emergent condition requiring admission or further testing in the hospital.  Your symptoms today seem to be due to bruising or strain to the muscles of the back.  Your x-ray today did show an irregularity to 1 of the bones, but it was unclear if this was a new fracture or an older fracture.  Based on your exam today, I am favoring this to be an older fracture.  Still I encourage you to mention this to your regular doctor, please use the muscle relaxer medication as needed at home for pain.  Please return to the Emergency Department if you experience any worsening of your condition.  We encourage you to follow up with a primary care provider.  Thank you for allowing [place] to be a part of your care.

## 2018-04-04 NOTE — ED Provider Notes (Signed)
[facility] Emergency Department Provider Note MRN:  [id number]  Arrival date & time: 04/04/18     Chief Complaint   Fall   History of Present Illness   Sheila Rice is a 72 y.o. year-old female with a history of GERD, hypertension presenting to the ED with chief complaint of fall.  Yesterday after church, the patient was taking a nap.  She stood up quickly from the nap and felt mildly lightheaded, causing her to stumble backwards into the wall.  Denies head trauma, no loss of consciousness.  Did not fall to the ground.  Endorsing only mild mid and left thoracic back pain from the trauma.  Given her history of osteoporosis, concerned that she broke a bone.  Pain is mild to moderate, constant, worse with motion.  Review of Systems  A complete 10 system review of systems was obtained and all systems are negative except as noted in the HPI and PMH.   Patient's Health History    Past Medical History:  Diagnosis Date  . GERD (gastroesophageal reflux disease)   . Hypertension     Past Surgical History:  Procedure Laterality Date  . BACK SURGERY     Spinal Stenosis  . BUNIONECTOMY    . LAPAROSCOPIC NISSEN FUNDOPLICATION N/A 2/58/5277   Procedure: LAPAROSCOPIC NISSEN FUNDOPLICATION and HIATAL HERNIA REPAIR;  Surgeon: Jackolyn Confer, MD;  Location: [facility];  Service: General;  Laterality: N/A;    No family history on file.  Social History   Socioeconomic History  . Marital status: Married    Spouse name: Not on file  . Number of children: Not on file  . Years of education: Not on file  . Highest education level: Not on file  Occupational History  . Not on file  Social Needs  . Financial resource strain: Not on file  . Food insecurity:    Worry: Not on file    Inability: Not on file  . Transportation needs:    Medical: Not on file    Non-medical: Not on file  Tobacco Use  . Smoking status: Never Smoker  . Smokeless tobacco: Never Used    Substance and Sexual Activity  . Alcohol use: Yes    Comment: Occa  . Drug use: No  . Sexual activity: Not on file  Lifestyle  . Physical activity:    Days per week: Not on file    Minutes per session: Not on file  . Stress: Not on file  Relationships  . Social connections:    Talks on phone: Not on file    Gets together: Not on file    Attends religious service: Not on file    Active member of club or organization: Not on file    Attends meetings of clubs or organizations: Not on file    Relationship status: Not on file  . Intimate partner violence:    Fear of current or ex partner: Not on file    Emotionally abused: Not on file    Physically abused: Not on file    Forced sexual activity: Not on file  Other Topics Concern  . Not on file  Social History Narrative  . Not on file     Physical Exam  Vital Signs and Nursing Notes reviewed Vitals:   04/04/18 0843  BP: (!) 192/72  Pulse: 84  Resp: 16  Temp: 97.8 F (36.6 C)  SpO2: 96%    CONSTITUTIONAL: Well-appearing, NAD NEURO:  Alert and  oriented x 3, no focal deficits EYES:  eyes equal and reactive ENT/NECK:  no LAD, no JVD CARDIO: Regular rate, well-perfused, normal S1 and S2 PULM:  CTAB no wheezing or rhonchi GI/GU:  normal bowel sounds, non-distended, non-tender MSK/SPINE:  No gross deformities, no edema, mild midline and left-sided thoracic back tenderness SKIN:  no rash, atraumatic PSYCH:  Appropriate speech and behavior  Diagnostic and Interventional Summary    EKG Interpretation  Date/Time:    Ventricular Rate:    PR Interval:    QRS Duration:   QT Interval:    QTC Calculation:   R Axis:     Text Interpretation:        Labs Reviewed - No data to display  DG Thoracic Spine 2 View  Final Result      Medications - No data to display   Procedures Critical Care  ED Course and Medical Decision Making  I have reviewed the triage vital signs and the nursing notes.  Pertinent labs & imaging  results that were available during my care of the patient were reviewed by me and considered in my medical decision making (see below for details).    X-ray to exclude fracture in this 72 year old female with osteoporosis.  Wedge-shaped irregularity of undetermined chronicity on x-ray, given the lack of midline tenderness, the large majority of this pain being lateral left thoracic pain, I am favoring a muscular nature to this pain today.  Patient mentions that her doctors have told her she has irregularities on x-rays in the past.  Patient will mention this x-ray finding to her doctor within the next few days.  Prescription for Flexeril.  Regarding patient's lightheadedness, favored to be orthostatic hypotension, which she states she experiences fairly frequently.  Did not have any chest pain prior to or after this dizziness, quickly resolved.  After the discussed management above, the patient was determined to be safe for discharge.  The patient was in agreement with this plan and all questions regarding their care were answered.  ED return precautions were discussed and the patient will return to the ED with any significant worsening of condition.   Barth Kirks. Sedonia Small, [place] [email or web address]  Final Clinical Impressions(s) / ED Diagnoses     ICD-10-CM   1. Acute left-sided thoracic back pain M54.6     ED Discharge Orders         Ordered    cyclobenzaprine (FLEXERIL) 10 MG tablet  2 times daily PRN     04/04/18 1016             Maudie Flakes, MD 04/04/18 1021

## 2018-04-18 DIAGNOSIS — M5136 Other intervertebral disc degeneration, lumbar region: Secondary | ICD-10-CM | POA: Diagnosis not present

## 2018-04-18 DIAGNOSIS — M48061 Spinal stenosis, lumbar region without neurogenic claudication: Secondary | ICD-10-CM | POA: Diagnosis not present

## 2018-04-18 DIAGNOSIS — M546 Pain in thoracic spine: Secondary | ICD-10-CM | POA: Diagnosis not present

## 2018-04-20 DIAGNOSIS — M8588 Other specified disorders of bone density and structure, other site: Secondary | ICD-10-CM | POA: Diagnosis not present

## 2018-04-20 DIAGNOSIS — M810 Age-related osteoporosis without current pathological fracture: Secondary | ICD-10-CM | POA: Diagnosis not present

## 2018-06-24 DIAGNOSIS — Z23 Encounter for immunization: Secondary | ICD-10-CM | POA: Diagnosis not present

## 2018-06-27 DIAGNOSIS — Z1231 Encounter for screening mammogram for malignant neoplasm of breast: Secondary | ICD-10-CM | POA: Diagnosis not present

## 2018-07-04 DIAGNOSIS — Z8639 Personal history of other endocrine, nutritional and metabolic disease: Secondary | ICD-10-CM | POA: Diagnosis not present

## 2018-07-04 DIAGNOSIS — E559 Vitamin D deficiency, unspecified: Secondary | ICD-10-CM | POA: Diagnosis not present

## 2018-07-04 DIAGNOSIS — E7800 Pure hypercholesterolemia, unspecified: Secondary | ICD-10-CM | POA: Diagnosis not present

## 2018-07-04 DIAGNOSIS — E119 Type 2 diabetes mellitus without complications: Secondary | ICD-10-CM | POA: Diagnosis not present

## 2018-07-07 DIAGNOSIS — K219 Gastro-esophageal reflux disease without esophagitis: Secondary | ICD-10-CM | POA: Diagnosis not present

## 2018-07-07 DIAGNOSIS — Z1389 Encounter for screening for other disorder: Secondary | ICD-10-CM | POA: Diagnosis not present

## 2018-07-07 DIAGNOSIS — Z0000 Encounter for general adult medical examination without abnormal findings: Secondary | ICD-10-CM | POA: Diagnosis not present

## 2018-07-07 DIAGNOSIS — M810 Age-related osteoporosis without current pathological fracture: Secondary | ICD-10-CM | POA: Diagnosis not present

## 2018-07-07 DIAGNOSIS — E559 Vitamin D deficiency, unspecified: Secondary | ICD-10-CM | POA: Diagnosis not present

## 2018-07-07 DIAGNOSIS — Z8639 Personal history of other endocrine, nutritional and metabolic disease: Secondary | ICD-10-CM | POA: Diagnosis not present

## 2018-07-07 DIAGNOSIS — E119 Type 2 diabetes mellitus without complications: Secondary | ICD-10-CM | POA: Diagnosis not present

## 2018-07-07 DIAGNOSIS — E7800 Pure hypercholesterolemia, unspecified: Secondary | ICD-10-CM | POA: Diagnosis not present

## 2018-07-07 DIAGNOSIS — R921 Mammographic calcification found on diagnostic imaging of breast: Secondary | ICD-10-CM | POA: Diagnosis not present

## 2018-07-07 DIAGNOSIS — I10 Essential (primary) hypertension: Secondary | ICD-10-CM | POA: Diagnosis not present

## 2018-07-13 ENCOUNTER — Other Ambulatory Visit: Payer: Self-pay | Admitting: Radiology

## 2018-07-13 DIAGNOSIS — N6323 Unspecified lump in the left breast, lower outer quadrant: Secondary | ICD-10-CM | POA: Diagnosis not present

## 2018-07-13 DIAGNOSIS — R590 Localized enlarged lymph nodes: Secondary | ICD-10-CM | POA: Diagnosis not present

## 2018-07-13 DIAGNOSIS — N6012 Diffuse cystic mastopathy of left breast: Secondary | ICD-10-CM | POA: Diagnosis not present

## 2018-07-28 ENCOUNTER — Ambulatory Visit (INDEPENDENT_AMBULATORY_CARE_PROVIDER_SITE_OTHER): Payer: Medicare Other | Admitting: Podiatry

## 2018-07-28 ENCOUNTER — Encounter: Payer: Self-pay | Admitting: Podiatry

## 2018-07-28 DIAGNOSIS — M7661 Achilles tendinitis, right leg: Secondary | ICD-10-CM

## 2018-07-28 DIAGNOSIS — B351 Tinea unguium: Secondary | ICD-10-CM

## 2018-07-28 MED ORDER — TRIAMCINOLONE ACETONIDE 10 MG/ML IJ SUSP
10.0000 mg | Freq: Once | INTRAMUSCULAR | Status: AC
  Administered 2018-07-28: 10 mg

## 2018-07-28 MED ORDER — TERBINAFINE HCL 250 MG PO TABS: ORAL_TABLET | ORAL | 0 refills | Status: DC

## 2018-07-28 NOTE — Progress Notes (Signed)
Subjective:   Patient ID: Sheila Rice, female   DOB: 73 y.o.   MRN: [id number]   HPI Patient presents stating that my right heel just flared up in the last few weeks and that my right big toenail third left are yellow again and I would like to have them treated again with laser therapy and possible medication   ROS      Objective:  Physical Exam  Neurovascular status intact with posterior discomfort in the heel right medial side with a central lateral band not involved and discoloration hallux nail right with thickness and third nail left     Assessment:  Achilles tendinitis right medial side along with mycotic nail infection hallux right third left with probable trauma of the right hallux nail     Plan:  H&P conditions reviewed and discussed injection explaining chances for rupture associated with the right and she is willing to accept risk and wants this done today I did a careful prep and I then injected carefully the medial side 3 mg Dexasone Kenalog with a small amount of Xylocaine advised on reduced activity ice therapy and dispensed a Achilles tendon sleeve.  I then will start pulse therapy for the nail and I did discuss laser and she is scheduled for laser treatment for the nail

## 2018-07-28 NOTE — Patient Instructions (Signed)

## 2018-08-08 ENCOUNTER — Ambulatory Visit: Payer: Self-pay

## 2018-08-08 DIAGNOSIS — B351 Tinea unguium: Secondary | ICD-10-CM

## 2018-08-08 DIAGNOSIS — M7661 Achilles tendinitis, right leg: Secondary | ICD-10-CM

## 2018-08-09 NOTE — Progress Notes (Signed)
Pt presents with mycotic infection of nails 1-5 bilateral.  All other systems are negative  Laser therapy administered to affected nails and tolerated well. All safety precautions were in place.  2nd treatment.  Follow up in 4 weeks     

## 2018-09-05 ENCOUNTER — Ambulatory Visit: Payer: Self-pay

## 2018-09-05 DIAGNOSIS — B351 Tinea unguium: Secondary | ICD-10-CM

## 2018-09-05 NOTE — Progress Notes (Signed)
Pt presents with mycotic infection of nails 1-5 bilateral.  All other systems are negative  Laser therapy administered to affected nails and tolerated well. All safety precautions were in place.  3rd treatment.  Follow up in 4 weeks     

## 2018-09-22 DIAGNOSIS — K449 Diaphragmatic hernia without obstruction or gangrene: Secondary | ICD-10-CM | POA: Diagnosis not present

## 2018-09-22 DIAGNOSIS — D123 Benign neoplasm of transverse colon: Secondary | ICD-10-CM | POA: Diagnosis not present

## 2018-09-22 DIAGNOSIS — Z86010 Personal history of colonic polyps: Secondary | ICD-10-CM | POA: Diagnosis not present

## 2018-09-22 DIAGNOSIS — K2270 Barrett's esophagus without dysplasia: Secondary | ICD-10-CM | POA: Diagnosis not present

## 2018-09-22 DIAGNOSIS — K6389 Other specified diseases of intestine: Secondary | ICD-10-CM | POA: Diagnosis not present

## 2018-09-22 DIAGNOSIS — Q399 Congenital malformation of esophagus, unspecified: Secondary | ICD-10-CM | POA: Diagnosis not present

## 2018-09-27 DIAGNOSIS — K2270 Barrett's esophagus without dysplasia: Secondary | ICD-10-CM | POA: Diagnosis not present

## 2018-09-27 DIAGNOSIS — D123 Benign neoplasm of transverse colon: Secondary | ICD-10-CM | POA: Diagnosis not present

## 2018-10-03 ENCOUNTER — Other Ambulatory Visit: Payer: Medicare Other

## 2019-01-10 DIAGNOSIS — H40013 Open angle with borderline findings, low risk, bilateral: Secondary | ICD-10-CM | POA: Diagnosis not present

## 2019-01-13 ENCOUNTER — Other Ambulatory Visit: Payer: Self-pay

## 2019-01-13 ENCOUNTER — Encounter: Payer: Self-pay | Admitting: Podiatry

## 2019-01-13 ENCOUNTER — Ambulatory Visit (INDEPENDENT_AMBULATORY_CARE_PROVIDER_SITE_OTHER): Payer: Medicare Other | Admitting: Podiatry

## 2019-01-13 ENCOUNTER — Ambulatory Visit (INDEPENDENT_AMBULATORY_CARE_PROVIDER_SITE_OTHER): Payer: Medicare Other

## 2019-01-13 ENCOUNTER — Other Ambulatory Visit: Payer: Self-pay | Admitting: Podiatry

## 2019-01-13 VITALS — Temp 98.2°F

## 2019-01-13 DIAGNOSIS — M79671 Pain in right foot: Secondary | ICD-10-CM

## 2019-01-13 DIAGNOSIS — M7661 Achilles tendinitis, right leg: Secondary | ICD-10-CM

## 2019-01-13 MED ORDER — NITROGLYCERIN 0.4 MG/HR TD PT24: 0.4000 mg | MEDICATED_PATCH | Freq: Every day | TRANSDERMAL | 1 refills | Status: DC

## 2019-01-17 ENCOUNTER — Other Ambulatory Visit: Payer: Self-pay

## 2019-01-17 ENCOUNTER — Ambulatory Visit: Payer: Medicare Other

## 2019-01-17 DIAGNOSIS — M7661 Achilles tendinitis, right leg: Secondary | ICD-10-CM

## 2019-01-17 DIAGNOSIS — M79676 Pain in unspecified toe(s): Secondary | ICD-10-CM

## 2019-01-17 NOTE — Progress Notes (Signed)
Subjective:   Patient ID: Sheila Rice, female   DOB: 73 y.o.   MRN: [id number]   HPI Patient states the right heel is still really bothering her and it feels like when she tries to use it that it gets sore   ROS      Objective:  Physical Exam  Neurovascular status intact with patient found to have posterior discomfort in the right heel with inflammation fluid buildup and pain with palpation     Assessment:  Chronic Achilles tendinitis right with inflammation     Plan:  H&P condition reviewed and at this point I have recommended immobilization with air fracture walker boot therapy.  I explained boot therapy and the reduction of activity and patient will be seen back 4 weeks or earlier if needed and will offload with the other foot

## 2019-01-20 NOTE — Progress Notes (Signed)
Patient is here today with complaint of chronic right heel pain, she states this is been going on for several months, she has received injections, anti-inflammatories, and various braces.  None of these therapies have helped, recently diagnosed with Achilles tendinitis right foot.  Pain on palpation to posterior medial and lateral sides of Achilles tendon.  ESWT administered for 5 J and tolerated well.  Advised her to avoid NSAIDs and ice and follow-up next week for her second treatment.

## 2019-01-26 ENCOUNTER — Other Ambulatory Visit: Payer: Self-pay

## 2019-01-26 ENCOUNTER — Ambulatory Visit: Payer: Self-pay

## 2019-01-26 DIAGNOSIS — M7661 Achilles tendinitis, right leg: Secondary | ICD-10-CM

## 2019-01-26 DIAGNOSIS — M79676 Pain in unspecified toe(s): Secondary | ICD-10-CM

## 2019-02-02 ENCOUNTER — Other Ambulatory Visit: Payer: Self-pay

## 2019-02-02 ENCOUNTER — Ambulatory Visit: Payer: Self-pay

## 2019-02-02 DIAGNOSIS — M7661 Achilles tendinitis, right leg: Secondary | ICD-10-CM

## 2019-02-02 DIAGNOSIS — M79671 Pain in right foot: Secondary | ICD-10-CM

## 2019-02-02 DIAGNOSIS — M79676 Pain in unspecified toe(s): Secondary | ICD-10-CM

## 2019-02-13 DIAGNOSIS — H25043 Posterior subcapsular polar age-related cataract, bilateral: Secondary | ICD-10-CM | POA: Diagnosis not present

## 2019-02-13 DIAGNOSIS — H2513 Age-related nuclear cataract, bilateral: Secondary | ICD-10-CM | POA: Diagnosis not present

## 2019-02-13 DIAGNOSIS — H2511 Age-related nuclear cataract, right eye: Secondary | ICD-10-CM | POA: Diagnosis not present

## 2019-02-13 DIAGNOSIS — H25013 Cortical age-related cataract, bilateral: Secondary | ICD-10-CM | POA: Diagnosis not present

## 2019-02-13 DIAGNOSIS — H16223 Keratoconjunctivitis sicca, not specified as Sjogren's, bilateral: Secondary | ICD-10-CM | POA: Diagnosis not present

## 2019-02-17 ENCOUNTER — Other Ambulatory Visit: Payer: Medicare Other

## 2019-02-26 NOTE — Progress Notes (Signed)
Patient is here today with complaint of chronic right heel pain, she states this is been going on for several months, she has received injections, anti-inflammatories, and various braces.  None of these therapies have helped, recently diagnosed with Achilles tendinitis right foot.  Pain on palpation to posterior medial and lateral sides of Achilles tendon.  ESWT administered for 6 J and tolerated well.  Advised her to avoid NSAIDs and ice and follow-up next week for her 3rd treatment.

## 2019-03-10 NOTE — Progress Notes (Signed)
Patient is here today with complaint of chronic right heel pain, she states this is been going on for several months, she has received injections, anti-inflammatories, and various braces.  None of these therapies have helped, recently diagnosed with Achilles tendinitis right foot.  Pain on palpation to posterior medial and lateral sides of Achilles tendon.  ESWT administered for 7 J and tolerated well.  Advised her to avoid NSAIDs and ice and follow-up next week for her 3rd treatment.

## 2019-03-14 DIAGNOSIS — H2511 Age-related nuclear cataract, right eye: Secondary | ICD-10-CM | POA: Diagnosis not present

## 2019-03-14 DIAGNOSIS — H25811 Combined forms of age-related cataract, right eye: Secondary | ICD-10-CM | POA: Diagnosis not present

## 2019-03-15 DIAGNOSIS — H25042 Posterior subcapsular polar age-related cataract, left eye: Secondary | ICD-10-CM | POA: Diagnosis not present

## 2019-03-15 DIAGNOSIS — H25012 Cortical age-related cataract, left eye: Secondary | ICD-10-CM | POA: Diagnosis not present

## 2019-03-15 DIAGNOSIS — H2512 Age-related nuclear cataract, left eye: Secondary | ICD-10-CM | POA: Diagnosis not present

## 2019-03-21 DIAGNOSIS — H2512 Age-related nuclear cataract, left eye: Secondary | ICD-10-CM | POA: Diagnosis not present

## 2019-03-21 DIAGNOSIS — H25812 Combined forms of age-related cataract, left eye: Secondary | ICD-10-CM | POA: Diagnosis not present

## 2019-04-05 DIAGNOSIS — H04123 Dry eye syndrome of bilateral lacrimal glands: Secondary | ICD-10-CM | POA: Diagnosis not present

## 2019-04-18 DIAGNOSIS — Z23 Encounter for immunization: Secondary | ICD-10-CM | POA: Diagnosis not present

## 2019-04-20 DIAGNOSIS — E559 Vitamin D deficiency, unspecified: Secondary | ICD-10-CM | POA: Diagnosis not present

## 2019-04-20 DIAGNOSIS — I10 Essential (primary) hypertension: Secondary | ICD-10-CM | POA: Diagnosis not present

## 2019-04-20 DIAGNOSIS — Z8639 Personal history of other endocrine, nutritional and metabolic disease: Secondary | ICD-10-CM | POA: Diagnosis not present

## 2019-04-20 DIAGNOSIS — K219 Gastro-esophageal reflux disease without esophagitis: Secondary | ICD-10-CM | POA: Diagnosis not present

## 2019-04-20 DIAGNOSIS — E7800 Pure hypercholesterolemia, unspecified: Secondary | ICD-10-CM | POA: Diagnosis not present

## 2019-04-20 DIAGNOSIS — E119 Type 2 diabetes mellitus without complications: Secondary | ICD-10-CM | POA: Diagnosis not present

## 2019-04-20 DIAGNOSIS — L299 Pruritus, unspecified: Secondary | ICD-10-CM | POA: Diagnosis not present

## 2019-04-25 DIAGNOSIS — E119 Type 2 diabetes mellitus without complications: Secondary | ICD-10-CM | POA: Diagnosis not present

## 2019-04-25 DIAGNOSIS — E7800 Pure hypercholesterolemia, unspecified: Secondary | ICD-10-CM | POA: Diagnosis not present

## 2019-04-25 DIAGNOSIS — K219 Gastro-esophageal reflux disease without esophagitis: Secondary | ICD-10-CM | POA: Diagnosis not present

## 2019-04-25 DIAGNOSIS — I10 Essential (primary) hypertension: Secondary | ICD-10-CM | POA: Diagnosis not present

## 2019-04-25 DIAGNOSIS — E559 Vitamin D deficiency, unspecified: Secondary | ICD-10-CM | POA: Diagnosis not present

## 2019-04-25 DIAGNOSIS — Z8639 Personal history of other endocrine, nutritional and metabolic disease: Secondary | ICD-10-CM | POA: Diagnosis not present

## 2019-05-02 DIAGNOSIS — K219 Gastro-esophageal reflux disease without esophagitis: Secondary | ICD-10-CM | POA: Diagnosis not present

## 2019-05-02 DIAGNOSIS — Z86010 Personal history of colonic polyps: Secondary | ICD-10-CM | POA: Diagnosis not present

## 2019-07-31 DIAGNOSIS — M810 Age-related osteoporosis without current pathological fracture: Secondary | ICD-10-CM | POA: Diagnosis not present

## 2019-07-31 DIAGNOSIS — E1169 Type 2 diabetes mellitus with other specified complication: Secondary | ICD-10-CM | POA: Diagnosis not present

## 2019-07-31 DIAGNOSIS — E7800 Pure hypercholesterolemia, unspecified: Secondary | ICD-10-CM | POA: Diagnosis not present

## 2019-07-31 DIAGNOSIS — E119 Type 2 diabetes mellitus without complications: Secondary | ICD-10-CM | POA: Diagnosis not present

## 2019-07-31 DIAGNOSIS — I10 Essential (primary) hypertension: Secondary | ICD-10-CM | POA: Diagnosis not present

## 2019-08-03 ENCOUNTER — Ambulatory Visit: Payer: Medicare Other

## 2019-08-10 ENCOUNTER — Ambulatory Visit: Payer: Medicare Other

## 2019-08-10 DIAGNOSIS — I491 Atrial premature depolarization: Secondary | ICD-10-CM | POA: Diagnosis not present

## 2019-08-10 DIAGNOSIS — E1169 Type 2 diabetes mellitus with other specified complication: Secondary | ICD-10-CM | POA: Diagnosis not present

## 2019-08-10 DIAGNOSIS — I10 Essential (primary) hypertension: Secondary | ICD-10-CM | POA: Diagnosis not present

## 2019-08-11 ENCOUNTER — Ambulatory Visit: Payer: Medicare Other | Attending: Internal Medicine

## 2019-08-11 DIAGNOSIS — E7800 Pure hypercholesterolemia, unspecified: Secondary | ICD-10-CM | POA: Diagnosis not present

## 2019-08-11 DIAGNOSIS — E1169 Type 2 diabetes mellitus with other specified complication: Secondary | ICD-10-CM | POA: Diagnosis not present

## 2019-08-11 DIAGNOSIS — E119 Type 2 diabetes mellitus without complications: Secondary | ICD-10-CM | POA: Diagnosis not present

## 2019-08-11 DIAGNOSIS — I10 Essential (primary) hypertension: Secondary | ICD-10-CM | POA: Diagnosis not present

## 2019-08-11 DIAGNOSIS — Z23 Encounter for immunization: Secondary | ICD-10-CM | POA: Insufficient documentation

## 2019-08-11 DIAGNOSIS — M810 Age-related osteoporosis without current pathological fracture: Secondary | ICD-10-CM | POA: Diagnosis not present

## 2019-08-11 NOTE — Progress Notes (Signed)
   Covid-19 Vaccination Clinic  Name:  Sheila Rice    MRN: [id number] DOB: 1946-03-21  08/11/2019  [name] was observed post Covid-19 immunization for 15 minutes without incidence. She was provided with Vaccine Information Sheet and instruction to access the V-Safe system.   [name] was instructed to call 911 with any severe reactions post vaccine: Marland Kitchen Difficulty breathing  . Swelling of your face and throat  . A fast heartbeat  . A bad rash all over your body  . Dizziness and weakness    Immunizations Administered    Name Date Dose VIS Date Route   Pfizer COVID-19 Vaccine 08/11/2019  9:06 AM 0.3 mL 06/16/2019 Intramuscular   Manufacturer: [place]   Lot: [id number]   [place]: [id number]

## 2019-08-29 DIAGNOSIS — I10 Essential (primary) hypertension: Secondary | ICD-10-CM | POA: Diagnosis not present

## 2019-08-29 DIAGNOSIS — E871 Hypo-osmolality and hyponatremia: Secondary | ICD-10-CM | POA: Diagnosis not present

## 2019-09-05 ENCOUNTER — Ambulatory Visit: Payer: Medicare Other | Attending: Internal Medicine

## 2019-09-05 DIAGNOSIS — Z23 Encounter for immunization: Secondary | ICD-10-CM

## 2019-09-05 NOTE — Progress Notes (Signed)
   Covid-19 Vaccination Clinic  Name:  Sheila Rice    MRN: [id number] DOB: [birth year]  09/05/2019  [name] was observed post Covid-19 immunization for 15 minutes without incident. She was provided with Vaccine Information Sheet and instruction to access the V-Safe system.   [name] was instructed to call 911 with any severe reactions post vaccine: Marland Kitchen Difficulty breathing  . Swelling of face and throat  . A fast heartbeat  . A bad rash all over body  . Dizziness and weakness   Immunizations Administered    Name Date Dose VIS Date Route   Pfizer COVID-19 Vaccine 09/05/2019  9:34 AM 0.3 mL 06/16/2019 Intramuscular   Manufacturer: [place]   Lot: [id number]   [place]: [id number]

## 2019-09-26 DIAGNOSIS — E1169 Type 2 diabetes mellitus with other specified complication: Secondary | ICD-10-CM | POA: Diagnosis not present

## 2019-09-26 DIAGNOSIS — M810 Age-related osteoporosis without current pathological fracture: Secondary | ICD-10-CM | POA: Diagnosis not present

## 2019-09-26 DIAGNOSIS — E7800 Pure hypercholesterolemia, unspecified: Secondary | ICD-10-CM | POA: Diagnosis not present

## 2019-09-26 DIAGNOSIS — I10 Essential (primary) hypertension: Secondary | ICD-10-CM | POA: Diagnosis not present

## 2019-09-26 DIAGNOSIS — E119 Type 2 diabetes mellitus without complications: Secondary | ICD-10-CM | POA: Diagnosis not present

## 2019-11-21 DIAGNOSIS — H2640 Unspecified secondary cataract: Secondary | ICD-10-CM | POA: Diagnosis not present

## 2019-12-21 DIAGNOSIS — I351 Nonrheumatic aortic (valve) insufficiency: Secondary | ICD-10-CM | POA: Diagnosis not present

## 2019-12-21 DIAGNOSIS — E7800 Pure hypercholesterolemia, unspecified: Secondary | ICD-10-CM | POA: Diagnosis not present

## 2019-12-21 DIAGNOSIS — R748 Abnormal levels of other serum enzymes: Secondary | ICD-10-CM | POA: Diagnosis not present

## 2019-12-21 DIAGNOSIS — E1169 Type 2 diabetes mellitus with other specified complication: Secondary | ICD-10-CM | POA: Diagnosis not present

## 2019-12-21 DIAGNOSIS — K219 Gastro-esophageal reflux disease without esophagitis: Secondary | ICD-10-CM | POA: Diagnosis not present

## 2019-12-21 DIAGNOSIS — Z0000 Encounter for general adult medical examination without abnormal findings: Secondary | ICD-10-CM | POA: Diagnosis not present

## 2019-12-21 DIAGNOSIS — M810 Age-related osteoporosis without current pathological fracture: Secondary | ICD-10-CM | POA: Diagnosis not present

## 2019-12-21 DIAGNOSIS — R5383 Other fatigue: Secondary | ICD-10-CM | POA: Diagnosis not present

## 2019-12-21 DIAGNOSIS — E559 Vitamin D deficiency, unspecified: Secondary | ICD-10-CM | POA: Diagnosis not present

## 2019-12-21 DIAGNOSIS — I10 Essential (primary) hypertension: Secondary | ICD-10-CM | POA: Diagnosis not present

## 2019-12-21 DIAGNOSIS — Z8639 Personal history of other endocrine, nutritional and metabolic disease: Secondary | ICD-10-CM | POA: Diagnosis not present

## 2020-01-23 DIAGNOSIS — H2640 Unspecified secondary cataract: Secondary | ICD-10-CM | POA: Diagnosis not present

## 2020-02-05 DIAGNOSIS — M810 Age-related osteoporosis without current pathological fracture: Secondary | ICD-10-CM | POA: Diagnosis not present

## 2020-02-05 DIAGNOSIS — I10 Essential (primary) hypertension: Secondary | ICD-10-CM | POA: Diagnosis not present

## 2020-02-05 DIAGNOSIS — E119 Type 2 diabetes mellitus without complications: Secondary | ICD-10-CM | POA: Diagnosis not present

## 2020-02-05 DIAGNOSIS — E7800 Pure hypercholesterolemia, unspecified: Secondary | ICD-10-CM | POA: Diagnosis not present

## 2020-02-05 DIAGNOSIS — E1169 Type 2 diabetes mellitus with other specified complication: Secondary | ICD-10-CM | POA: Diagnosis not present

## 2020-03-13 ENCOUNTER — Emergency Department (HOSPITAL_BASED_OUTPATIENT_CLINIC_OR_DEPARTMENT_OTHER): Payer: Medicare Other

## 2020-03-13 ENCOUNTER — Other Ambulatory Visit: Payer: Self-pay

## 2020-03-13 ENCOUNTER — Encounter (HOSPITAL_BASED_OUTPATIENT_CLINIC_OR_DEPARTMENT_OTHER): Payer: Self-pay | Admitting: *Deleted

## 2020-03-13 DIAGNOSIS — K449 Diaphragmatic hernia without obstruction or gangrene: Secondary | ICD-10-CM | POA: Diagnosis present

## 2020-03-13 DIAGNOSIS — K219 Gastro-esophageal reflux disease without esophagitis: Secondary | ICD-10-CM | POA: Diagnosis present

## 2020-03-13 DIAGNOSIS — R944 Abnormal results of kidney function studies: Secondary | ICD-10-CM | POA: Diagnosis present

## 2020-03-13 DIAGNOSIS — Z981 Arthrodesis status: Secondary | ICD-10-CM

## 2020-03-13 DIAGNOSIS — K2951 Unspecified chronic gastritis with bleeding: Secondary | ICD-10-CM | POA: Diagnosis not present

## 2020-03-13 DIAGNOSIS — J439 Emphysema, unspecified: Secondary | ICD-10-CM | POA: Diagnosis not present

## 2020-03-13 DIAGNOSIS — R945 Abnormal results of liver function studies: Secondary | ICD-10-CM | POA: Diagnosis present

## 2020-03-13 DIAGNOSIS — M160 Bilateral primary osteoarthritis of hip: Secondary | ICD-10-CM | POA: Diagnosis not present

## 2020-03-13 DIAGNOSIS — E876 Hypokalemia: Secondary | ICD-10-CM | POA: Diagnosis present

## 2020-03-13 DIAGNOSIS — D62 Acute posthemorrhagic anemia: Secondary | ICD-10-CM | POA: Diagnosis present

## 2020-03-13 DIAGNOSIS — Z20822 Contact with and (suspected) exposure to covid-19: Secondary | ICD-10-CM | POA: Diagnosis present

## 2020-03-13 DIAGNOSIS — I10 Essential (primary) hypertension: Secondary | ICD-10-CM | POA: Diagnosis present

## 2020-03-13 DIAGNOSIS — K920 Hematemesis: Secondary | ICD-10-CM | POA: Diagnosis not present

## 2020-03-13 DIAGNOSIS — K2210 Ulcer of esophagus without bleeding: Secondary | ICD-10-CM | POA: Diagnosis present

## 2020-03-13 DIAGNOSIS — K317 Polyp of stomach and duodenum: Secondary | ICD-10-CM | POA: Diagnosis present

## 2020-03-13 DIAGNOSIS — E871 Hypo-osmolality and hyponatremia: Secondary | ICD-10-CM | POA: Diagnosis present

## 2020-03-13 DIAGNOSIS — E860 Dehydration: Secondary | ICD-10-CM | POA: Diagnosis present

## 2020-03-13 DIAGNOSIS — M654 Radial styloid tenosynovitis [de Quervain]: Secondary | ICD-10-CM | POA: Diagnosis present

## 2020-03-13 DIAGNOSIS — R112 Nausea with vomiting, unspecified: Secondary | ICD-10-CM | POA: Diagnosis not present

## 2020-03-13 DIAGNOSIS — E878 Other disorders of electrolyte and fluid balance, not elsewhere classified: Secondary | ICD-10-CM | POA: Diagnosis present

## 2020-03-13 DIAGNOSIS — I700 Atherosclerosis of aorta: Secondary | ICD-10-CM | POA: Diagnosis not present

## 2020-03-13 DIAGNOSIS — M1990 Unspecified osteoarthritis, unspecified site: Secondary | ICD-10-CM | POA: Diagnosis present

## 2020-03-13 LAB — COMPREHENSIVE METABOLIC PANEL
ALT: 52 U/L — ABNORMAL HIGH (ref 0–44)
AST: 117 U/L — ABNORMAL HIGH (ref 15–41)
Albumin: 4 g/dL (ref 3.5–5.0)
Alkaline Phosphatase: 67 U/L (ref 38–126)
Anion gap: 13 (ref 5–15)
BUN: 27 mg/dL — ABNORMAL HIGH (ref 8–23)
CO2: 24 mmol/L (ref 22–32)
Calcium: 9.1 mg/dL (ref 8.9–10.3)
Chloride: 96 mmol/L — ABNORMAL LOW (ref 98–111)
Creatinine, Ser: 0.76 mg/dL (ref 0.44–1.00)
GFR calc Af Amer: >60 mL/min (ref >60)
GFR calc non Af Amer: >60 mL/min (ref >60)
Glucose, Bld: 116 mg/dL — ABNORMAL HIGH (ref 70–99)
Potassium: 3.5 mmol/L (ref 3.5–5.1)
Sodium: 133 mmol/L — ABNORMAL LOW (ref 135–145)
Total Bilirubin: 0.4 mg/dL (ref 0.3–1.2)
Total Protein: 7.4 g/dL (ref 6.5–8.1)

## 2020-03-13 LAB — CBC WITH DIFFERENTIAL/PLATELET
Abs Immature Granulocytes: 0.01 10*3/uL (ref 0.00–0.07)
Basophils Absolute: 0 10*3/uL (ref 0.0–0.1)
Basophils Relative: 0 %
Eosinophils Absolute: 0.4 10*3/uL (ref 0.0–0.5)
Eosinophils Relative: 6 %
HCT: 36.3 % (ref 36.0–46.0)
Hemoglobin: 12 g/dL (ref 12.0–15.0)
Immature Granulocytes: 0 %
Lymphocytes Relative: 34 %
Lymphs Abs: 2.4 10*3/uL (ref 0.7–4.0)
MCH: 29.6 pg (ref 26.0–34.0)
MCHC: 33.1 g/dL (ref 30.0–36.0)
MCV: 89.6 fL (ref 80.0–100.0)
Monocytes Absolute: 0.7 10*3/uL (ref 0.1–1.0)
Monocytes Relative: 10 %
Neutro Abs: 3.4 10*3/uL (ref 1.7–7.7)
Neutrophils Relative %: 50 %
Platelets: 201 10*3/uL (ref 150–400)
RBC: 4.05 MIL/uL (ref 3.87–5.11)
RDW: 12.9 % (ref 11.5–15.5)
WBC: 7 10*3/uL (ref 4.0–10.5)
nRBC: 0 % (ref 0.0–0.2)

## 2020-03-13 LAB — URINALYSIS, ROUTINE W REFLEX MICROSCOPIC
Bilirubin Urine: NEGATIVE
Glucose, UA: NEGATIVE mg/dL
Ketones, ur: NEGATIVE mg/dL
Nitrite: NEGATIVE
Protein, ur: NEGATIVE mg/dL
Specific Gravity, Urine: 1.025 (ref 1.005–1.030)
pH: 5.5 (ref 5.0–8.0)

## 2020-03-13 LAB — URINALYSIS, MICROSCOPIC (REFLEX)

## 2020-03-13 LAB — LIPASE, BLOOD: Lipase: 34 U/L (ref 11–51)

## 2020-03-13 MED ORDER — SODIUM CHLORIDE 0.9 % IV SOLN
80.0000 mg | Freq: Once | INTRAVENOUS | Status: AC
  Administered 2020-03-14: 80 mg via INTRAVENOUS
  Filled 2020-03-13 (×2): qty 80

## 2020-03-13 MED ORDER — SODIUM CHLORIDE 0.9 % IV SOLN
8.0000 mg/h | INTRAVENOUS | Status: DC
  Administered 2020-03-14 (×2): 8 mg/h via INTRAVENOUS
  Filled 2020-03-13 (×7): qty 80

## 2020-03-13 MED ORDER — PANTOPRAZOLE SODIUM 40 MG IV SOLR: 40.0000 mg | Freq: Two times a day (BID) | INTRAVENOUS | Status: DC

## 2020-03-13 NOTE — ED Triage Notes (Signed)
Pt c/o n/v with black coffee ground emesis x 30 mins

## 2020-03-14 ENCOUNTER — Other Ambulatory Visit: Payer: Self-pay

## 2020-03-14 ENCOUNTER — Inpatient Hospital Stay (HOSPITAL_BASED_OUTPATIENT_CLINIC_OR_DEPARTMENT_OTHER)
Admission: EM | Admit: 2020-03-14 | Discharge: 2020-03-15 | DRG: 378 | Disposition: A | Discharge status: Home / Self Care | Payer: Medicare Other | Attending: Family Medicine | Admitting: Family Medicine

## 2020-03-14 ENCOUNTER — Encounter (HOSPITAL_BASED_OUTPATIENT_CLINIC_OR_DEPARTMENT_OTHER): Payer: Self-pay | Admitting: Emergency Medicine

## 2020-03-14 ENCOUNTER — Observation Stay (HOSPITAL_COMMUNITY): Payer: Medicare Other

## 2020-03-14 DIAGNOSIS — Z789 Other specified health status: Secondary | ICD-10-CM

## 2020-03-14 DIAGNOSIS — K317 Polyp of stomach and duodenum: Secondary | ICD-10-CM | POA: Diagnosis present

## 2020-03-14 DIAGNOSIS — K2950 Unspecified chronic gastritis without bleeding: Secondary | ICD-10-CM | POA: Diagnosis not present

## 2020-03-14 DIAGNOSIS — I10 Essential (primary) hypertension: Secondary | ICD-10-CM | POA: Diagnosis present

## 2020-03-14 DIAGNOSIS — E876 Hypokalemia: Secondary | ICD-10-CM

## 2020-03-14 DIAGNOSIS — M654 Radial styloid tenosynovitis [de Quervain]: Secondary | ICD-10-CM | POA: Diagnosis present

## 2020-03-14 DIAGNOSIS — K219 Gastro-esophageal reflux disease without esophagitis: Secondary | ICD-10-CM | POA: Diagnosis present

## 2020-03-14 DIAGNOSIS — K2970 Gastritis, unspecified, without bleeding: Secondary | ICD-10-CM | POA: Diagnosis not present

## 2020-03-14 DIAGNOSIS — Z791 Long term (current) use of non-steroidal anti-inflammatories (NSAID): Secondary | ICD-10-CM | POA: Diagnosis not present

## 2020-03-14 DIAGNOSIS — E878 Other disorders of electrolyte and fluid balance, not elsewhere classified: Secondary | ICD-10-CM | POA: Diagnosis present

## 2020-03-14 DIAGNOSIS — M1990 Unspecified osteoarthritis, unspecified site: Secondary | ICD-10-CM

## 2020-03-14 DIAGNOSIS — Z20822 Contact with and (suspected) exposure to covid-19: Secondary | ICD-10-CM | POA: Diagnosis present

## 2020-03-14 DIAGNOSIS — R7989 Other specified abnormal findings of blood chemistry: Secondary | ICD-10-CM

## 2020-03-14 DIAGNOSIS — K449 Diaphragmatic hernia without obstruction or gangrene: Secondary | ICD-10-CM | POA: Diagnosis present

## 2020-03-14 DIAGNOSIS — K2951 Unspecified chronic gastritis with bleeding: Secondary | ICD-10-CM | POA: Diagnosis present

## 2020-03-14 DIAGNOSIS — R945 Abnormal results of liver function studies: Secondary | ICD-10-CM | POA: Diagnosis present

## 2020-03-14 DIAGNOSIS — Z7289 Other problems related to lifestyle: Secondary | ICD-10-CM | POA: Diagnosis not present

## 2020-03-14 DIAGNOSIS — D62 Acute posthemorrhagic anemia: Secondary | ICD-10-CM | POA: Diagnosis present

## 2020-03-14 DIAGNOSIS — K920 Hematemesis: Secondary | ICD-10-CM

## 2020-03-14 DIAGNOSIS — K7689 Other specified diseases of liver: Secondary | ICD-10-CM | POA: Diagnosis not present

## 2020-03-14 DIAGNOSIS — K2210 Ulcer of esophagus without bleeding: Secondary | ICD-10-CM | POA: Diagnosis present

## 2020-03-14 DIAGNOSIS — R944 Abnormal results of kidney function studies: Secondary | ICD-10-CM | POA: Diagnosis present

## 2020-03-14 DIAGNOSIS — E871 Hypo-osmolality and hyponatremia: Secondary | ICD-10-CM | POA: Diagnosis present

## 2020-03-14 DIAGNOSIS — Z981 Arthrodesis status: Secondary | ICD-10-CM | POA: Diagnosis not present

## 2020-03-14 DIAGNOSIS — K3189 Other diseases of stomach and duodenum: Secondary | ICD-10-CM | POA: Diagnosis not present

## 2020-03-14 DIAGNOSIS — E860 Dehydration: Secondary | ICD-10-CM | POA: Diagnosis present

## 2020-03-14 DIAGNOSIS — F1090 Alcohol use, unspecified, uncomplicated: Secondary | ICD-10-CM

## 2020-03-14 LAB — CBC
HCT: 33.5 % — ABNORMAL LOW (ref 36.0–46.0)
Hemoglobin: 11.1 g/dL — ABNORMAL LOW (ref 12.0–15.0)
MCH: 30 pg (ref 26.0–34.0)
MCHC: 33.1 g/dL (ref 30.0–36.0)
MCV: 90.5 fL (ref 80.0–100.0)
Platelets: 194 10*3/uL (ref 150–400)
RBC: 3.7 MIL/uL — ABNORMAL LOW (ref 3.87–5.11)
RDW: 13.2 % (ref 11.5–15.5)
WBC: 6 10*3/uL (ref 4.0–10.5)
nRBC: 0 % (ref 0.0–0.2)

## 2020-03-14 LAB — COMPREHENSIVE METABOLIC PANEL
ALT: 38 U/L (ref 0–44)
AST: 35 U/L (ref 15–41)
Albumin: 3.7 g/dL (ref 3.5–5.0)
Alkaline Phosphatase: 56 U/L (ref 38–126)
Anion gap: 9 (ref 5–15)
BUN: 16 mg/dL (ref 8–23)
CO2: 26 mmol/L (ref 22–32)
Calcium: 8.8 mg/dL — ABNORMAL LOW (ref 8.9–10.3)
Chloride: 102 mmol/L (ref 98–111)
Creatinine, Ser: 0.72 mg/dL (ref 0.44–1.00)
GFR calc Af Amer: >60 mL/min (ref >60)
GFR calc non Af Amer: >60 mL/min (ref >60)
Glucose, Bld: 100 mg/dL — ABNORMAL HIGH (ref 70–99)
Potassium: 3.3 mmol/L — ABNORMAL LOW (ref 3.5–5.1)
Sodium: 137 mmol/L (ref 135–145)
Total Bilirubin: 0.6 mg/dL (ref 0.3–1.2)
Total Protein: 6.6 g/dL (ref 6.5–8.1)

## 2020-03-14 LAB — SARS CORONAVIRUS 2 BY RT PCR (HOSPITAL ORDER, PERFORMED IN ~~LOC~~ HOSPITAL LAB): SARS Coronavirus 2: NEGATIVE

## 2020-03-14 LAB — OCCULT BLOOD X 1 CARD TO LAB, STOOL: Fecal Occult Bld: NEGATIVE

## 2020-03-14 LAB — PROTIME-INR
INR: 0.9 (ref 0.8–1.2)
Prothrombin Time: 12.2 seconds (ref 11.4–15.2)

## 2020-03-14 LAB — TROPONIN I (HIGH SENSITIVITY): Troponin I (High Sensitivity): 5 ng/L (ref <18)

## 2020-03-14 MED ORDER — SODIUM CHLORIDE 0.9 % IV SOLN: Freq: Once | INTRAVENOUS | Status: AC

## 2020-03-14 MED ORDER — ALBUTEROL SULFATE (2.5 MG/3ML) 0.083% IN NEBU
2.5000 mg | INHALATION_SOLUTION | Freq: Four times a day (QID) | RESPIRATORY_TRACT | Status: DC
  Filled 2020-03-14: qty 3

## 2020-03-14 MED ORDER — ZOLPIDEM TARTRATE 5 MG PO TABS
5.0000 mg | ORAL_TABLET | Freq: Every evening | ORAL | Status: DC | PRN
  Administered 2020-03-14: 5 mg via ORAL
  Filled 2020-03-14: qty 1

## 2020-03-14 MED ORDER — ATORVASTATIN CALCIUM 20 MG PO TABS
20.0000 mg | ORAL_TABLET | Freq: Every day | ORAL | Status: DC
  Administered 2020-03-14: 20 mg via ORAL
  Filled 2020-03-14: qty 1

## 2020-03-14 MED ORDER — LOSARTAN POTASSIUM 50 MG PO TABS
100.0000 mg | ORAL_TABLET | Freq: Every day | ORAL | Status: DC
  Administered 2020-03-14: 100 mg via ORAL
  Filled 2020-03-14: qty 2

## 2020-03-14 MED ORDER — ACETAMINOPHEN 650 MG RE SUPP: 650.0000 mg | Freq: Four times a day (QID) | RECTAL | Status: DC | PRN

## 2020-03-14 MED ORDER — POTASSIUM CHLORIDE 10 MEQ/100ML IV SOLN
10.0000 meq | INTRAVENOUS | Status: AC
  Administered 2020-03-14 (×2): 10 meq via INTRAVENOUS
  Filled 2020-03-14 (×2): qty 100

## 2020-03-14 MED ORDER — AMLODIPINE BESYLATE 5 MG PO TABS
2.5000 mg | ORAL_TABLET | Freq: Every day | ORAL | Status: DC
  Administered 2020-03-14: 2.5 mg via ORAL
  Filled 2020-03-14: qty 1

## 2020-03-14 MED ORDER — ACETAMINOPHEN 325 MG PO TABS: 650.0000 mg | ORAL_TABLET | Freq: Four times a day (QID) | ORAL | Status: DC | PRN

## 2020-03-14 MED ORDER — ONDANSETRON HCL 4 MG PO TABS: 4.0000 mg | ORAL_TABLET | Freq: Four times a day (QID) | ORAL | Status: DC | PRN

## 2020-03-14 MED ORDER — ONDANSETRON HCL 4 MG/2ML IJ SOLN: 4.0000 mg | Freq: Four times a day (QID) | INTRAMUSCULAR | Status: DC | PRN

## 2020-03-14 MED ORDER — SODIUM CHLORIDE 0.9 % IV SOLN: INTRAVENOUS | Status: DC

## 2020-03-14 MED ORDER — ALBUTEROL SULFATE (2.5 MG/3ML) 0.083% IN NEBU: 2.5000 mg | INHALATION_SOLUTION | Freq: Four times a day (QID) | RESPIRATORY_TRACT | Status: DC | PRN

## 2020-03-14 NOTE — ED Notes (Signed)
ED Provider at bedside. 

## 2020-03-14 NOTE — H&P (Addendum)
History and Physical    Sheila Rice:[id number] DOB: [birth year] DOA: 03/14/2020  Referring MD/NP/PA: Inda Merlin, MD PCP: [name], MD  Patient coming from: Transfer from [facility]  Chief Complaint: Vomiting   I have personally briefly reviewed patient's old medical records in [place]   HPI: Sheila Rice is a 74 y.o. female with medical history significant of hypertension, osteoarthritis, Barrett's esophagus, GERD, and hiatal hernia s/p Nissen fundoplication presents after having 3 episodes of coffee-ground emesis yesterday evening.  She has never had symptoms like this before.  Denied having any fever, chills, or abdominal pain.  She does suffer from arthritis pain and normally takes 1 -2 BC Goody powders per day on average.  Patient also normally drinks 1 glass of wine per day with dinner.  Patient normally gets routine colonoscopies and EGDs with [name].  Denies any repeat episodes of hematemesis since being admitted into the hospital.  ED Course: Upon admission to the emergency department patient was seen to be afebrile with blood pressures 125/79-190 4/105, and all other vital signs maintained.  Labs significant for hemoglobin 12, sodium 133, chloride 96, BUN 27, creatinine 0.76, AST 117, ALT 52, and all other vital signs relatively within normal limits.  Stool guaiacs were noted to be negative.  Acute abdominal series showed no acute pulmonary disease and air-fluid levels in the esophagus. Patient was started on a Protonix drip and normal saline IV fluids at 125 mL/h.   Review of Systems  Constitutional: Negative for chills and fever.  HENT: Negative for ear discharge and nosebleeds.   Eyes: Negative for double vision.  Respiratory: Negative for shortness of breath.   Cardiovascular: Negative for chest pain and leg swelling.  Gastrointestinal: Positive for heartburn, nausea and vomiting.  Genitourinary: Negative for dysuria and hematuria.  Skin: Negative for  itching and rash.  Neurological: Negative for focal weakness and loss of consciousness.  Psychiatric/Behavioral: Positive for substance abuse.    Past Medical History:  Diagnosis Date  . GERD (gastroesophageal reflux disease)   . Hypertension     Past Surgical History:  Procedure Laterality Date  . BACK SURGERY     Spinal Stenosis  . BUNIONECTOMY    . LAPAROSCOPIC NISSEN FUNDOPLICATION N/A 02/13/9146   Procedure: LAPAROSCOPIC NISSEN FUNDOPLICATION and HIATAL HERNIA REPAIR;  Surgeon: Jackolyn Confer, MD;  Location: [facility];  Service: General;  Laterality: N/A;     reports that she has never smoked. She has never used smokeless tobacco. She reports current alcohol use. She reports that she does not use drugs.  Allergies  Allergen Reactions  . Ace Inhibitors Cough    History reviewed. No pertinent family history.  Prior to Admission medications   Medication Sig Start Date End Date Taking? Authorizing Provider  atorvastatin (LIPITOR) 10 MG tablet Take 10 mg by mouth daily.    [provider]  CALCIUM CITRATE PO Take 1,600 mg by mouth daily. Takes (2) 800 mg tablets daily    [provider]  cyclobenzaprine (FLEXERIL) 10 MG tablet Take 1 tablet (10 mg total) by mouth 2 (two) times daily as needed for muscle spasms. 04/04/18   Maudie Flakes, MD  esomeprazole (NEXIUM) 40 MG capsule Take 40 mg by mouth daily.    [provider]  GAVILYTE-N WITH FLAVOR PACK 420 g solution See admin instructions. 09/15/18   [provider]  hydrochlorothiazide (HYDRODIURIL) 12.5 MG tablet Take 12.5 mg by mouth daily.    [provider]  lidocaine (LIDODERM) 5 % Place 1 patch onto the skin daily. Remove & Discard patch within 12 hours or as directed by MD 09/30/17   Couture, Cortni S, PA-C  Lifitegrast (XIIDRA) 5 % SOLN Place 1 drop into both eyes 2 (two) times daily.    [provider]  losartan (COZAAR) 100 MG tablet Take 100 mg by mouth daily.     [provider]  metoCLOPramide (REGLAN) 10 MG tablet Take 10 mg by mouth daily.    [provider]  nitroGLYCERIN (NITRO-DUR) 0.4 mg/hr patch Place 1 patch (0.4 mg total) onto the skin daily. 01/13/19   Wallene Huh, DPM  terbinafine (LAMISIL) 250 MG tablet Please take one a day x 7days, repeat every 4 weeks x 4 months 07/28/18   Wallene Huh, DPM    Physical Exam:  Constitutional: NAD, calm, comfortable Vitals:   03/14/20 0800 03/14/20 0900 03/14/20 1125 03/14/20 1129  BP: (!) 155/66 (!) 147/66 (!) 194/105 (!) 179/74  Pulse: 71 69 75 64  Resp: 12 14 18 18   Temp:   (!) 97.5 F (36.4 C) (!) 97.5 F (36.4 C)  TempSrc:   Oral Oral  SpO2: 99% 97% 97% 97%  Weight:      Height:       Eyes: PERRL, lids and conjunctivae normal ENMT: Mucous membranes are moist. Posterior pharynx clear of any exudate or lesions.Normal dentition.  Neck: normal, supple, no masses, no thyromegaly Respiratory: clear to auscultation bilaterally, no wheezing, no crackles. Normal respiratory effort. No accessory muscle use.  Cardiovascular: Regular rate and rhythm, no murmurs / rubs / gallops. No extremity edema. 2+ pedal pulses. No carotid bruits.  Abdomen: no tenderness, no masses palpated. No hepatosplenomegaly. Bowel sounds positive.  Musculoskeletal: no clubbing / cyanosis. No joint deformity upper and lower extremities. Good ROM, no contractures. Normal muscle tone.  Skin: no rashes, lesions, ulcers. No induration Neurologic: CN 2-12 grossly intact. Sensation intact, DTR normal. Strength 5/5 in all 4.  Psychiatric: Normal judgment and insight. Alert and oriented x 3. Normal mood.     Labs on Admission: I have personally reviewed following labs and imaging studies  CBC: Recent Labs  Lab 03/13/20 2147  WBC 7.0  NEUTROABS 3.4  HGB 12.0  HCT 36.3  MCV 89.6  PLT 295   Basic Metabolic Panel: Recent Labs  Lab 03/13/20 2147  NA 133*  K 3.5  CL 96*  CO2 24  GLUCOSE 116*    BUN 27*  CREATININE 0.76  CALCIUM 9.1   GFR: Estimated Creatinine Clearance: 48.1 mL/min (by C-G formula based on SCr of 0.76 mg/dL). Liver Function Tests: Recent Labs  Lab 03/13/20 2147  AST 117*  ALT 52*  ALKPHOS 67  BILITOT 0.4  PROT 7.4  ALBUMIN 4.0   Recent Labs  Lab 03/13/20 2147  LIPASE 34   No results for input(s): AMMONIA in the last 168 hours. Coagulation Profile: Recent Labs  Lab 03/13/20 2149  INR 0.9   Cardiac Enzymes: No results for input(s): CKTOTAL, CKMB, CKMBINDEX, TROPONINI in the last 168 hours. BNP (last 3 results) No results for input(s): PROBNP in the last 8760 hours. HbA1C: No results for input(s): HGBA1C in the last 72 hours. CBG: No results for input(s): GLUCAP in the last 168 hours. Lipid Profile: No results for input(s): CHOL, HDL, LDLCALC, TRIG, CHOLHDL, LDLDIRECT in the last 72 hours. Thyroid Function Tests: No results for input(s): TSH, T4TOTAL, FREET4, T3FREE, THYROIDAB in the last 72 hours. Anemia Panel:  No results for input(s): VITAMINB12, FOLATE, FERRITIN, TIBC, IRON, RETICCTPCT in the last 72 hours. Urine analysis:    Component Value Date/Time   COLORURINE YELLOW 03/13/2020 2149   APPEARANCEUR CLEAR 03/13/2020 2149   LABSPEC 1.025 03/13/2020 2149   PHURINE 5.5 03/13/2020 2149   GLUCOSEU NEGATIVE 03/13/2020 2149   HGBUR SMALL (A) 03/13/2020 2149   BILIRUBINUR NEGATIVE 03/13/2020 2149   KETONESUR NEGATIVE 03/13/2020 2149   PROTEINUR NEGATIVE 03/13/2020 2149   UROBILINOGEN 0.2 06/20/2008 0851   NITRITE NEGATIVE 03/13/2020 2149   LEUKOCYTESUR SMALL (A) 03/13/2020 2149   Sepsis Labs: Recent Results (from the past 240 hour(s))  SARS Coronavirus 2 by RT PCR (hospital order, performed in [facility] hospital lab) Nasopharyngeal Nasopharyngeal Swab     Status: None   Collection Time: 03/14/20 12:18 AM   Specimen: Nasopharyngeal Swab  Result Value Ref Range Status   SARS Coronavirus 2 NEGATIVE NEGATIVE Final    Comment:  (NOTE) SARS-CoV-2 target nucleic acids are NOT DETECTED.  The SARS-CoV-2 RNA is generally detectable in upper and lower respiratory specimens during the acute phase of infection. The lowest concentration of SARS-CoV-2 viral copies this assay can detect is 250 copies / mL. A negative result does not preclude SARS-CoV-2 infection and should not be used as the sole basis for treatment or other patient management decisions.  A negative result may occur with improper specimen collection / handling, submission of specimen other than nasopharyngeal swab, presence of viral mutation(s) within the areas targeted by this assay, and inadequate number of viral copies (<250 copies / mL). A negative result must be combined with clinical observations, patient history, and epidemiological information.  Fact Sheet for Patients:   StrictlyIdeas.no  Fact Sheet for Healthcare Providers: BankingDealers.co.za  This test is not yet approved or  cleared by the [place] FDA and has been authorized for detection and/or diagnosis of SARS-CoV-2 by FDA under an Emergency Use Authorization (EUA).  This EUA will remain in effect (meaning this test can be used) for the duration of the COVID-19 declaration under Section 564(b)(1) of the Act, 21 U.S.C. section 360bbb-3(b)(1), unless the authorization is terminated or revoked sooner.  Performed at [facility], [address] [place], [place] [address]      Radiological Exams on Admission: DG Abdomen Acute W/Chest  Result Date: 03/13/2020 CLINICAL DATA:  Nausea and vomiting with coffee ground emesis for 30 minutes. EXAM: DG ABDOMEN ACUTE W/ 1V CHEST COMPARISON:  None. FINDINGS: Emphysematous changes in the lungs. Heart size and pulmonary vascularity are normal. No airspace disease or consolidation. No pleural effusions. No pneumothorax. There is an air-fluid level in the esophagus, possibly indicating  reflux, dysmotility/achalasia, or esophageal obstruction. Calcification of the aorta. Scattered gas and stool throughout the colon. No small or large bowel distention. No free intra-abdominal air. No abnormal abdominal air-fluid levels. No radiopaque stones. Degenerative changes in the spine and hips. Old fracture deformity of the right pubic rami and symphysis pubis. Surgical screws in the lower lumbar spine. IMPRESSION: No evidence of active pulmonary disease. Air-fluid level in the esophagus, possibly indicating reflux, dysmotility/achalasia, or esophageal obstruction. Nonobstructive bowel gas pattern. Electronically Signed   By: Lucienne Capers M.D.   On: 03/13/2020 23:54    EKG: Independently reviewed.  Sinus rhythm at 70 bpm  Assessment/Plan Hematemesis Acute blood loss anemia: Acute.  Patient presents after having 3 episodes of hematemesis at home.  Hemoglobin initially noted to be 12 and on repeat check 11.1.  Stool  guaiacs were negative.  Patient otherwise hemodynamically stable.   Suspecting upper GI bleed secondary to [facility] powder use. She was started on a Protonix drip.  -Admit to a MedSurg bed -N.p.o. after midnight -Monitor intake and output -Continue to monitor H&H -Continue Protonix drip -[facility] consulted with plans for EGD on 9/10  Hypokalemia: Acute.  Potassium on lab work from today was 3.3. -Give 20 mEq of potassium chloride IV.  Hyponatremia/hypochloremia/elevated liver enzymes/dehydration: Resolved.  Labs on admission revealed sodium 133, chloride 96, BUN 27, creatinine 0.76, AST 117, and ALT 52.  Repeat lab work shows all labs now within normal limits.  Alcohol use: Patient reports drinking 1 glass of wine per night on average.  Denies any prior history of withdrawal symptoms. -Continue to monitor and consider starting CIWA protocols if any signs of withdrawal.  Arthritis pain -Discussed with patient need to avoid [facility] Goody powders and other NSAIDs due to risk of  peptic ulcers.  Advised using alternative such as Tylenol or external creams.  GERD/Barrett's esophagus/hiatal hernia s/p nissen fundoplication: Home medications include Nexium. -PPI as seen above  DVT prophylaxis: SCDs Code Status: Full Disposition Plan: Likely discharge home in 1 to 2 days. Consults called: Dr. Fabio Pierce GI Admission status: Inpatient requiring more than 2 midnight stay due to need of evaluation of cause of bleeding.  Norval Morton MD [facility] Pager [phone]   If 7PM-7AM, please contact night-coverage [email or web address] Password [facility]  03/14/2020, 11:36 AM

## 2020-03-14 NOTE — Consult Note (Signed)
Referring Provider:  EDP Primary Care Physician:  [name], MD Primary Gastroenterologist:  [name]  Reason for Consultation: Coffee-ground emesis  HPI: Sheila Rice is a 74 y.o. female with past medical history of chronic reflux, history of hiatal hernia status post Nissen fundoplication, history of chronic BCs powder use presented to the [facility] with 3 episodes of coffee-ground emesis yesterday.  Denies any further episodes.  Denies any associated melena or abdominal pain.  Denies any bright red blood per rectum.  Denies any diarrhea or constipation.  No bowel movement today.  Patient uses BCs powder once or twice daily for many years.  Takes Nexium for acid reflux with intermittent breakthrough symptoms.  Denies any trouble swallowing food or pain while swallowing.  Past Medical History:  Diagnosis Date  . GERD (gastroesophageal reflux disease)   . Hypertension     Past Surgical History:  Procedure Laterality Date  . BACK SURGERY     Spinal Stenosis  . BUNIONECTOMY    . LAPAROSCOPIC NISSEN FUNDOPLICATION N/A 11/30/7822   Procedure: LAPAROSCOPIC NISSEN FUNDOPLICATION and HIATAL HERNIA REPAIR;  Surgeon: Jackolyn Confer, MD;  Location: [facility];  Service: General;  Laterality: N/A;    Prior to Admission medications   Medication Sig Start Date End Date Taking? Authorizing Provider  amLODipine (NORVASC) 2.5 MG tablet Take 2.5 mg by mouth daily. 03/12/20  Yes [provider]  Aspirin-Salicylamide-Caffeine (BC HEADACHE POWDER PO) Take 1 packet by mouth as needed (headache/pain).   Yes [provider]  atorvastatin (LIPITOR) 20 MG tablet Take 20 mg by mouth daily. 02/25/20  Yes [provider]  CALCIUM CITRATE PO Take 0.5 tablets by mouth in the morning and at bedtime.    Yes [provider]  esomeprazole (NEXIUM) 40 MG capsule Take 40 mg by mouth daily.   Yes [provider]  hydrochlorothiazide (HYDRODIURIL) 25 MG tablet  Take 25 mg by mouth every morning. 01/13/20  Yes [provider]  losartan (COZAAR) 100 MG tablet Take 100 mg by mouth daily.   Yes [provider]  polyvinyl alcohol (LIQUIFILM TEARS) 1.4 % ophthalmic solution Place 1 drop into both eyes as needed for dry eyes.   Yes [provider]  VITAMIN D PO Take 1 capsule by mouth daily.   Yes [provider]  cyclobenzaprine (FLEXERIL) 10 MG tablet Take 1 tablet (10 mg total) by mouth 2 (two) times daily as needed for muscle spasms. Patient not taking: Reported on 03/14/2020 04/04/18   Maudie Flakes, MD  lidocaine (LIDODERM) 5 % Place 1 patch onto the skin daily. Remove & Discard patch within 12 hours or as directed by MD Patient not taking: Reported on 03/14/2020 09/30/17   Couture, Cortni S, PA-C  nitroGLYCERIN (NITRO-DUR) 0.4 mg/hr patch Place 1 patch (0.4 mg total) onto the skin daily. Patient not taking: Reported on 03/14/2020 01/13/19   Wallene Huh, DPM  terbinafine (LAMISIL) 250 MG tablet Please take one a day x 7days, repeat every 4 weeks x 4 months Patient not taking: Reported on 03/14/2020 07/28/18   Wallene Huh, DPM    Scheduled Meds: . albuterol  2.5 mg Nebulization Q6H  . [START ON 03/17/2020] pantoprazole  40 mg Intravenous Q12H   Continuous Infusions: . sodium chloride 125 mL/hr at 03/14/20 1151  . pantoprozole (PROTONIX) infusion 8 mg/hr (03/14/20 0955)   PRN Meds:.acetaminophen **OR** acetaminophen, ondansetron **OR** ondansetron (ZOFRAN) IV  Allergies as of 03/13/2020 - Review Complete 03/13/2020  Allergen Reaction Noted  . Ace inhibitors Cough 01/05/2017    History reviewed. No pertinent family history.  Social History   Socioeconomic History  . Marital status: Married    Spouse name: Not on file  . Number of children: Not on file  . Years of education: Not on file  . Highest education level: Not on file  Occupational History  . Not on file  Tobacco Use  . Smoking status: Never  Smoker  . Smokeless tobacco: Never Used  Substance and Sexual Activity  . Alcohol use: Yes    Comment: Occa  . Drug use: No  . Sexual activity: Not on file  Other Topics Concern  . Not on file  Social History Narrative  . Not on file   Social Determinants of Health   Financial Resource Strain:   . Difficulty of Paying Living Expenses: Not on file  Food Insecurity:   . Worried About Charity fundraiser in the Last Year: Not on file  . Ran Out of Food in the Last Year: Not on file  Transportation Needs:   . Lack of Transportation (Medical): Not on file  . Lack of Transportation (Non-Medical): Not on file  Physical Activity:   . Days of Exercise per Week: Not on file  . Minutes of Exercise per Session: Not on file  Stress:   . Feeling of Stress : Not on file  Social Connections:   . Frequency of Communication with Friends and Family: Not on file  . Frequency of Social Gatherings with Friends and Family: Not on file  . Attends Religious Services: Not on file  . Active Member of Clubs or Organizations: Not on file  . Attends Archivist Meetings: Not on file  . Marital Status: Not on file  Intimate Partner Violence:   . Fear of Current or Ex-Partner: Not on file  . Emotionally Abused: Not on file  . Physically Abused: Not on file  . Sexually Abused: Not on file    Review of Systems: All negative except as stated above in HPI.  Physical Exam: Vital signs: Vitals:   03/14/20 1125 03/14/20 1129  BP: (!) 194/105 (!) 179/74  Pulse: 75 64  Resp: 18 18  Temp: (!) 97.5 F (36.4 C) (!) 97.5 F (36.4 C)  SpO2: 97% 97%   Last BM Date: 03/13/20 Physical Exam Vitals and nursing note reviewed.  Constitutional:      General: She is not in acute distress.    Appearance: Normal appearance.  HENT:     Head: Normocephalic and atraumatic.     Nose: Nose normal.  Eyes:     Extraocular Movements: Extraocular movements intact.  Cardiovascular:     Rate and Rhythm:  Normal rate and regular rhythm.     Heart sounds: Murmur heard.   Pulmonary:     Effort: Pulmonary effort is normal. No respiratory distress.  Abdominal:     General: Bowel sounds are normal. There is no distension.     Palpations: Abdomen is soft.     Tenderness: There is no abdominal tenderness. There is no guarding.  Musculoskeletal:     Cervical back: Normal range of motion and neck supple.     Right lower leg: No edema.     Left lower leg: No edema.  Skin:    General: Skin is warm.  Neurological:     Mental Status: She is alert and oriented to person, place, and time.  Psychiatric:  Mood and Affect: Mood normal.        Thought Content: Thought content normal.     GI:  Lab Results: Recent Labs    03/13/20 2147  WBC 7.0  HGB 12.0  HCT 36.3  PLT 201   BMET Recent Labs    03/13/20 2147  NA 133*  K 3.5  CL 96*  CO2 24  GLUCOSE 116*  BUN 27*  CREATININE 0.76  CALCIUM 9.1   LFT Recent Labs    03/13/20 2147  PROT 7.4  ALBUMIN 4.0  AST 117*  ALT 52*  ALKPHOS 67  BILITOT 0.4   PT/INR Recent Labs    03/13/20 2149  LABPROT 12.2  INR 0.9     Studies/Results: DG Abdomen Acute W/Chest  Result Date: 03/13/2020 CLINICAL DATA:  Nausea and vomiting with coffee ground emesis for 30 minutes. EXAM: DG ABDOMEN ACUTE W/ 1V CHEST COMPARISON:  None. FINDINGS: Emphysematous changes in the lungs. Heart size and pulmonary vascularity are normal. No airspace disease or consolidation. No pleural effusions. No pneumothorax. There is an air-fluid level in the esophagus, possibly indicating reflux, dysmotility/achalasia, or esophageal obstruction. Calcification of the aorta. Scattered gas and stool throughout the colon. No small or large bowel distention. No free intra-abdominal air. No abnormal abdominal air-fluid levels. No radiopaque stones. Degenerative changes in the spine and hips. Old fracture deformity of the right pubic rami and symphysis pubis. Surgical screws  in the lower lumbar spine. IMPRESSION: No evidence of active pulmonary disease. Air-fluid level in the esophagus, possibly indicating reflux, dysmotility/achalasia, or esophageal obstruction. Nonobstructive bowel gas pattern. Electronically Signed   By: Lucienne Capers M.D.   On: 03/13/2020 23:54    Impression/Plan: -Coffee-ground emesis in setting of significant NSAID use.  Elevated BUN.  Normal hemoglobin.  Most likely gastritis versus ulcer disease. -S/p Nissen fundoplication. -Mild elevated LFTs.  Normal INR.  Normal platelet count.  Recommendations ------------------------- -Continue PPI for now -Plan for EGD tomorrow.  Okay to have full liquid diet today. -Ultrasound abdomen right upper quadrant for evaluation of abnormal LFTs and nausea vomiting. -Repeat blood work -GI will follow  Risks (bleeding, infection, bowel perforation that could require surgery, sedation-related changes in cardiopulmonary systems), benefits (identification and possible treatment of source of symptoms, exclusion of certain causes of symptoms), and alternatives (watchful waiting, radiographic imaging studies, empiric medical treatment)  were explained to patient/family in detail and patient wishes to proceed.    LOS: 0 days   Otis Brace  MD, FACP 03/14/2020, 12:46 PM  Contact #  [phone]

## 2020-03-14 NOTE — ED Provider Notes (Signed)
[facility] Provider Note   CSN: [id number] Arrival date & time: 03/13/20  2136     History Chief Complaint  Patient presents with  . Emesis    Sheila Rice is a 74 y.o. female.  The history is provided by the patient.  Emesis Severity:  Moderate Timing:  Rare Quality:  Coffee grounds Progression:  Unchanged Chronicity:  New Recent urination:  Normal Context: not post-tussive   Relieved by:  Nothing Worsened by:  Nothing Ineffective treatments:  None tried Associated symptoms: no abdominal pain, no arthralgias, no cough and no fever   Risk factors: no alcohol use   Patient with GERD s/p Nissen fundoplication presents with hematemesis this evening.  Ate dinner and had wine and then began vomiting.  The patient is not on iron supplements nor did she take pepto.  Her GI specialist is [name].       Past Medical History:  Diagnosis Date  . GERD (gastroesophageal reflux disease)   . Hypertension     Patient Active Problem List   Diagnosis Date Noted  . Degeneration of lumbar intervertebral disc 10/22/2017  . Osteoporosis 10/22/2017  . Low back pain 10/21/2017  . Spinal stenosis of lumbar region 10/21/2017  . Hypertension   . GERD (gastroesophageal reflux disease)   . Hiatal hernia s/p lap repair & Niseen fundoplication 10/05/270 53/66/4403    Past Surgical History:  Procedure Laterality Date  . BACK SURGERY     Spinal Stenosis  . BUNIONECTOMY    . LAPAROSCOPIC NISSEN FUNDOPLICATION N/A 4/74/2595   Procedure: LAPAROSCOPIC NISSEN FUNDOPLICATION and HIATAL HERNIA REPAIR;  Surgeon: Jackolyn Confer, MD;  Location: [facility];  Service: General;  Laterality: N/A;     OB History   No obstetric history on file.     History reviewed. No pertinent family history.  Social History   Tobacco Use  . Smoking status: Never Smoker  . Smokeless tobacco: Never Used  Substance Use Topics  . Alcohol use: Yes    Comment: Occa  . Drug use:  No    Home Medications Prior to Admission medications   Medication Sig Start Date End Date Taking? Authorizing Provider  atorvastatin (LIPITOR) 10 MG tablet Take 10 mg by mouth daily.    [provider]  CALCIUM CITRATE PO Take 1,600 mg by mouth daily. Takes (2) 800 mg tablets daily    [provider]  cyclobenzaprine (FLEXERIL) 10 MG tablet Take 1 tablet (10 mg total) by mouth 2 (two) times daily as needed for muscle spasms. 04/04/18   Maudie Flakes, MD  esomeprazole (NEXIUM) 40 MG capsule Take 40 mg by mouth daily.    [provider]  GAVILYTE-N WITH FLAVOR PACK 420 g solution See admin instructions. 09/15/18   [provider]  hydrochlorothiazide (HYDRODIURIL) 12.5 MG tablet Take 12.5 mg by mouth daily.    [provider]  lidocaine (LIDODERM) 5 % Place 1 patch onto the skin daily. Remove & Discard patch within 12 hours or as directed by MD 09/30/17   Couture, Cortni S, PA-C  Lifitegrast (XIIDRA) 5 % SOLN Place 1 drop into both eyes 2 (two) times daily.    [provider]  losartan (COZAAR) 100 MG tablet Take 100 mg by mouth daily.    [provider]  metoCLOPramide (REGLAN) 10 MG tablet Take 10 mg by mouth daily.    [provider]  nitroGLYCERIN (NITRO-DUR) 0.4 mg/hr patch Place 1 patch (0.4 mg total)  onto the skin daily. 01/13/19   Wallene Huh, DPM  terbinafine (LAMISIL) 250 MG tablet Please take one a day x 7days, repeat every 4 weeks x 4 months 07/28/18   Wallene Huh, DPM    Allergies    Ace inhibitors  Review of Systems   Review of Systems  Constitutional: Negative for fever.  HENT: Negative for congestion.   Eyes: Negative for visual disturbance.  Respiratory: Negative for cough.   Gastrointestinal: Positive for vomiting. Negative for abdominal pain and anal bleeding.  Genitourinary: Negative for difficulty urinating.  Musculoskeletal: Negative for arthralgias.  Skin: Negative for rash.    Neurological: Negative for dizziness.  Psychiatric/Behavioral: Negative for agitation.  All other systems reviewed and are negative.   Physical Exam Updated Vital Signs BP (!) 148/65   Pulse 72   Temp 98.1 F (36.7 C)   Resp 11   Ht 5' (1.524 m)   Wt 59 kg   SpO2 97%   BMI 25.39 kg/m   Physical Exam Vitals and nursing note reviewed.  Constitutional:      General: She is not in acute distress.    Appearance: Normal appearance.  HENT:     Head: Normocephalic and atraumatic.     Nose: Nose normal.  Eyes:     Conjunctiva/sclera: Conjunctivae normal.     Pupils: Pupils are equal, round, and reactive to light.  Cardiovascular:     Rate and Rhythm: Normal rate and regular rhythm.     Pulses: Normal pulses.     Heart sounds: Normal heart sounds.  Pulmonary:     Effort: Pulmonary effort is normal.     Breath sounds: Normal breath sounds.  Abdominal:     General: Abdomen is flat. Bowel sounds are normal.     Palpations: Abdomen is soft.     Tenderness: There is no abdominal tenderness. There is no guarding or rebound.  Musculoskeletal:        General: Normal range of motion.     Cervical back: Normal range of motion.  Skin:    General: Skin is warm and dry.     Capillary Refill: Capillary refill takes less than 2 seconds.  Neurological:     General: No focal deficit present.     Mental Status: She is alert and oriented to person, place, and time.     Deep Tendon Reflexes: Reflexes normal.  Psychiatric:        Mood and Affect: Mood normal.        Behavior: Behavior normal.     ED Results / Procedures / Treatments   Labs (all labs ordered are listed, but only abnormal results are displayed) Results for orders placed or performed during the hospital encounter of 03/14/20  SARS Coronavirus 2 by RT PCR (hospital order, performed in [place] hospital lab) Nasopharyngeal Nasopharyngeal Swab   Specimen: Nasopharyngeal Swab  Result Value Ref Range   SARS Coronavirus 2  NEGATIVE NEGATIVE  CBC with Differential  Result Value Ref Range   WBC 7.0 4.0 - 10.5 K/uL   RBC 4.05 3.87 - 5.11 MIL/uL   Hemoglobin 12.0 12.0 - 15.0 g/dL   HCT 36.3 36 - 46 %   MCV 89.6 80.0 - 100.0 fL   MCH 29.6 26.0 - 34.0 pg   MCHC 33.1 30.0 - 36.0 g/dL   RDW 12.9 11.5 - 15.5 %   Platelets 201 150 - 400 K/uL   nRBC 0.0 0.0 - 0.2 %   Neutrophils  Relative % 50 %   Neutro Abs 3.4 1.7 - 7.7 K/uL   Lymphocytes Relative 34 %   Lymphs Abs 2.4 0.7 - 4.0 K/uL   Monocytes Relative 10 %   Monocytes Absolute 0.7 0 - 1 K/uL   Eosinophils Relative 6 %   Eosinophils Absolute 0.4 0 - 0 K/uL   Basophils Relative 0 %   Basophils Absolute 0.0 0 - 0 K/uL   Immature Granulocytes 0 %   Abs Immature Granulocytes 0.01 0.00 - 0.07 K/uL  Comprehensive metabolic panel  Result Value Ref Range   Sodium 133 (L) 135 - 145 mmol/L   Potassium 3.5 3.5 - 5.1 mmol/L   Chloride 96 (L) 98 - 111 mmol/L   CO2 24 22 - 32 mmol/L   Glucose, Bld 116 (H) 70 - 99 mg/dL   BUN 27 (H) 8 - 23 mg/dL   Creatinine, Ser 0.76 0.44 - 1.00 mg/dL   Calcium 9.1 8.9 - 10.3 mg/dL   Total Protein 7.4 6.5 - 8.1 g/dL   Albumin 4.0 3.5 - 5.0 g/dL   AST 117 (H) 15 - 41 U/L   ALT 52 (H) 0 - 44 U/L   Alkaline Phosphatase 67 38 - 126 U/L   Total Bilirubin 0.4 0.3 - 1.2 mg/dL   GFR calc non Af Amer >60 >60 mL/min   GFR calc Af Amer >60 >60 mL/min   Anion gap 13 5 - 15  Urinalysis, Routine w reflex microscopic Urine, Clean Catch  Result Value Ref Range   Color, Urine YELLOW YELLOW   APPearance CLEAR CLEAR   Specific Gravity, Urine 1.025 1.005 - 1.030   pH 5.5 5.0 - 8.0   Glucose, UA NEGATIVE NEGATIVE mg/dL   Hgb urine dipstick SMALL (A) NEGATIVE   Bilirubin Urine NEGATIVE NEGATIVE   Ketones, ur NEGATIVE NEGATIVE mg/dL   Protein, ur NEGATIVE NEGATIVE mg/dL   Nitrite NEGATIVE NEGATIVE   Leukocytes,Ua SMALL (A) NEGATIVE  Lipase, blood  Result Value Ref Range   Lipase 34 11 - 51 U/L  Urinalysis, Microscopic (reflex)  Result  Value Ref Range   RBC / HPF 6-10 0 - 5 RBC/hpf   WBC, UA 6-10 0 - 5 WBC/hpf   Bacteria, UA RARE (A) NONE SEEN   Squamous Epithelial / LPF 0-5 0 - 5  Occult blood card to lab, stool  Result Value Ref Range   Fecal Occult Bld NEGATIVE NEGATIVE  Protime-INR  Result Value Ref Range   Prothrombin Time 12.2 11.4 - 15.2 seconds   INR 0.9 0.8 - 1.2  Troponin I (High Sensitivity)  Result Value Ref Range   Troponin I (High Sensitivity) 5 <18 ng/L   DG Abdomen Acute W/Chest  Result Date: 03/13/2020 CLINICAL DATA:  Nausea and vomiting with coffee ground emesis for 30 minutes. EXAM: DG ABDOMEN ACUTE W/ 1V CHEST COMPARISON:  None. FINDINGS: Emphysematous changes in the lungs. Heart size and pulmonary vascularity are normal. No airspace disease or consolidation. No pleural effusions. No pneumothorax. There is an air-fluid level in the esophagus, possibly indicating reflux, dysmotility/achalasia, or esophageal obstruction. Calcification of the aorta. Scattered gas and stool throughout the colon. No small or large bowel distention. No free intra-abdominal air. No abnormal abdominal air-fluid levels. No radiopaque stones. Degenerative changes in the spine and hips. Old fracture deformity of the right pubic rami and symphysis pubis. Surgical screws in the lower lumbar spine. IMPRESSION: No evidence of active pulmonary disease. Air-fluid level in the esophagus, possibly indicating reflux, dysmotility/achalasia,  or esophageal obstruction. Nonobstructive bowel gas pattern. Electronically Signed   By: Lucienne Capers M.D.   On: 03/13/2020 23:54    None  Radiology DG Abdomen Acute W/Chest  Result Date: 03/13/2020 CLINICAL DATA:  Nausea and vomiting with coffee ground emesis for 30 minutes. EXAM: DG ABDOMEN ACUTE W/ 1V CHEST COMPARISON:  None. FINDINGS: Emphysematous changes in the lungs. Heart size and pulmonary vascularity are normal. No airspace disease or consolidation. No pleural effusions. No pneumothorax.  There is an air-fluid level in the esophagus, possibly indicating reflux, dysmotility/achalasia, or esophageal obstruction. Calcification of the aorta. Scattered gas and stool throughout the colon. No small or large bowel distention. No free intra-abdominal air. No abnormal abdominal air-fluid levels. No radiopaque stones. Degenerative changes in the spine and hips. Old fracture deformity of the right pubic rami and symphysis pubis. Surgical screws in the lower lumbar spine. IMPRESSION: No evidence of active pulmonary disease. Air-fluid level in the esophagus, possibly indicating reflux, dysmotility/achalasia, or esophageal obstruction. Nonobstructive bowel gas pattern. Electronically Signed   By: Lucienne Capers M.D.   On: 03/13/2020 23:54    Procedures Procedures (including critical care time)  Medications Ordered in ED Medications  pantoprazole (PROTONIX) 80 mg in sodium chloride 0.9 % 100 mL (0.8 mg/mL) infusion (8 mg/hr Intravenous New Bag/Given 03/14/20 0054)  pantoprazole (PROTONIX) injection 40 mg (has no administration in time range)  pantoprazole (PROTONIX) 80 mg in sodium chloride 0.9 % 100 mL IVPB (0 mg Intravenous Stopped 03/14/20 0053)    ED Course  I have reviewed the triage vital signs and the nursing notes.  Pertinent labs & imaging results that were available during my care of the patient were reviewed by me and considered in my medical decision making (see chart for details).    Epic message sent to [name] on call for [facility].    Sheila Rice was evaluated in Emergency Department on 03/14/2020 for the symptoms described in the history of present illness. She was evaluated in the context of the global COVID-19 pandemic, which necessitated consideration that the patient might be at risk for infection with the SARS-CoV-2 virus that causes COVID-19. Institutional protocols and algorithms that pertain to the evaluation of patients at risk for COVID-19 are in a state of rapid change  based on information released by regulatory bodies including the CDC and federal and state organizations. These policies and algorithms were followed during the patient's care in the ED.  Final Clinical Impression(s) / ED Diagnoses Final diagnoses:  Hematemesis, presence of nausea not specified    Admit to medicine.     Donathan Buller, MD 03/14/20 2993

## 2020-03-15 ENCOUNTER — Encounter (HOSPITAL_COMMUNITY)
Admission: EM | Disposition: A | Discharge status: Home / Self Care | Payer: Self-pay | Source: Home / Self Care | Attending: Internal Medicine

## 2020-03-15 ENCOUNTER — Encounter (HOSPITAL_COMMUNITY): Payer: Self-pay | Admitting: Internal Medicine

## 2020-03-15 ENCOUNTER — Inpatient Hospital Stay (HOSPITAL_COMMUNITY): Payer: Medicare Other | Admitting: Certified Registered Nurse Anesthetist

## 2020-03-15 HISTORY — PX: BIOPSY: SHX5522

## 2020-03-15 HISTORY — PX: ESOPHAGOGASTRODUODENOSCOPY (EGD) WITH PROPOFOL: SHX5813

## 2020-03-15 LAB — CBC
HCT: 32.6 % — ABNORMAL LOW (ref 36.0–46.0)
Hemoglobin: 10.7 g/dL — ABNORMAL LOW (ref 12.0–15.0)
MCH: 30.1 pg (ref 26.0–34.0)
MCHC: 32.8 g/dL (ref 30.0–36.0)
MCV: 91.6 fL (ref 80.0–100.0)
Platelets: 171 10*3/uL (ref 150–400)
RBC: 3.56 MIL/uL — ABNORMAL LOW (ref 3.87–5.11)
RDW: 13.2 % (ref 11.5–15.5)
WBC: 5.9 10*3/uL (ref 4.0–10.5)
nRBC: 0 % (ref 0.0–0.2)

## 2020-03-15 LAB — BASIC METABOLIC PANEL
Anion gap: 10 (ref 5–15)
BUN: 11 mg/dL (ref 8–23)
CO2: 23 mmol/L (ref 22–32)
Calcium: 8.7 mg/dL — ABNORMAL LOW (ref 8.9–10.3)
Chloride: 107 mmol/L (ref 98–111)
Creatinine, Ser: 0.64 mg/dL (ref 0.44–1.00)
GFR calc Af Amer: >60 mL/min (ref >60)
GFR calc non Af Amer: >60 mL/min (ref >60)
Glucose, Bld: 108 mg/dL — ABNORMAL HIGH (ref 70–99)
Potassium: 3.8 mmol/L (ref 3.5–5.1)
Sodium: 140 mmol/L (ref 135–145)

## 2020-03-15 SURGERY — ESOPHAGOGASTRODUODENOSCOPY (EGD) WITH PROPOFOL
Anesthesia: Monitor Anesthesia Care

## 2020-03-15 MED ORDER — PROPOFOL 500 MG/50ML IV EMUL
INTRAVENOUS | Status: DC | PRN
  Administered 2020-03-15: 125 ug/kg/min via INTRAVENOUS

## 2020-03-15 MED ORDER — PANTOPRAZOLE SODIUM 40 MG PO TBEC: 40.0000 mg | DELAYED_RELEASE_TABLET | Freq: Two times a day (BID) | ORAL | Status: DC

## 2020-03-15 MED ORDER — ESOMEPRAZOLE MAGNESIUM 40 MG PO CPDR
40.0000 mg | DELAYED_RELEASE_CAPSULE | Freq: Every day | ORAL | 2 refills | Status: DC
Start: 2020-03-15 — End: 2020-03-15

## 2020-03-15 MED ORDER — LACTATED RINGERS IV SOLN
INTRAVENOUS | Status: DC
  Administered 2020-03-15: 1000 mL via INTRAVENOUS

## 2020-03-15 MED ORDER — ACETAMINOPHEN 325 MG PO TABS: 650.0000 mg | ORAL_TABLET | Freq: Four times a day (QID) | ORAL | Status: DC | PRN

## 2020-03-15 MED ORDER — PROPOFOL 10 MG/ML IV BOLUS
INTRAVENOUS | Status: DC | PRN
  Administered 2020-03-15 (×2): 20 mg via INTRAVENOUS

## 2020-03-15 MED ORDER — DICLOFENAC SODIUM 1 % EX GEL: 2.0000 g | Freq: Four times a day (QID) | CUTANEOUS | 2 refills | Status: DC

## 2020-03-15 MED ORDER — LIDOCAINE 2% (20 MG/ML) 5 ML SYRINGE
INTRAMUSCULAR | Status: DC | PRN
  Administered 2020-03-15: 60 mg via INTRAVENOUS

## 2020-03-15 MED ORDER — ESOMEPRAZOLE MAGNESIUM 40 MG PO CPDR
40.0000 mg | DELAYED_RELEASE_CAPSULE | Freq: Two times a day (BID) | ORAL | 2 refills | Status: DC
Start: 2020-03-15 — End: 2022-01-20

## 2020-03-15 SURGICAL SUPPLY — 15 items

## 2020-03-15 NOTE — Anesthesia Preprocedure Evaluation (Addendum)
Anesthesia Evaluation  Patient identified by MRN, date of birth, ID band  Reviewed: Allergy & Precautions, NPO status   Airway Mallampati: II  TM Distance: >3 FB Neck ROM: Full    Dental no notable dental hx.    Pulmonary neg pulmonary ROS,    Pulmonary exam normal breath sounds clear to auscultation       Cardiovascular hypertension, Normal cardiovascular exam Rhythm:Regular Rate:Normal     Neuro/Psych negative psych ROS   GI/Hepatic Neg liver ROS, hiatal hernia, GERD  ,  Endo/Other  negative endocrine ROS  Renal/GU negative Renal ROS     Musculoskeletal  (+) Arthritis ,   Abdominal   Peds negative pediatric ROS (+)  Hematology negative hematology ROS (+) anemia ,   Anesthesia Other Findings   Reproductive/Obstetrics                            Anesthesia Physical Anesthesia Plan  ASA: II  Anesthesia Plan: MAC   Post-op Pain Management:    Induction:   PONV Risk Score and Plan: 2 and Propofol infusion, TIVA and Treatment may vary due to age or medical condition  Airway Management Planned: Nasal Cannula  Additional Equipment:   Intra-op Plan:   Post-operative Plan:   Informed Consent: I have reviewed the patients History and Physical, chart, labs and discussed the procedure including the risks, benefits and alternatives for the proposed anesthesia with the patient or authorized representative who has indicated his/her understanding and acceptance.       Plan Discussed with: CRNA, Anesthesiologist and Surgeon  Anesthesia Plan Comments:        Anesthesia Quick Evaluation

## 2020-03-15 NOTE — Anesthesia Postprocedure Evaluation (Signed)
Anesthesia Post Note  Patient: [employer]  Procedure(s) Performed: ESOPHAGOGASTRODUODENOSCOPY (EGD) WITH PROPOFOL (N/A ) BIOPSY     Patient location during evaluation: PACU Anesthesia Type: MAC Level of consciousness: awake and alert Pain management: pain level controlled Vital Signs Assessment: post-procedure vital signs reviewed and stable Respiratory status: spontaneous breathing and respiratory function stable Cardiovascular status: stable Postop Assessment: no apparent nausea or vomiting Anesthetic complications: no   No complications documented.  Last Vitals:  Vitals:   03/15/20 1010 03/15/20 1015  BP: (!) 119/45 (!) 153/57  Pulse: 70 68  Resp: 17 19  Temp:    SpO2: 98% 98%    Last Pain:  Vitals:   03/15/20 1015  TempSrc:   PainSc: 0-No pain                 Merlinda Frederick

## 2020-03-15 NOTE — Progress Notes (Signed)
[facility] Progress Note  Sheila Rice 74 y.o. May 10, 1946  CC: Coffee-ground emesis   Subjective: Patient seen and examined at bedside.  Denies any further vomiting.  Denies black-colored stool.  Denies abdominal pain, nausea and vomiting.  ROS : Afebrile.  Negative for chest pain   Objective: Vital signs in last 24 hours: Vitals:   03/14/20 2049 03/15/20 0558  BP: (!) 155/72 (!) 173/67  Pulse: 70 70  Resp: 18 20  Temp: 98.4 F (36.9 C) 99 F (37.2 C)  SpO2: 94% 97%    Physical Exam:  General:  Alert, cooperative, no distress, appears stated age  Head:  Normocephalic, without obvious abnormality, atraumatic  Eyes:  , EOM's intact,   Lungs:   Clear to auscultation bilaterally, respirations unlabored  Heart:  Regular rate and rhythm, S1, S2 normal  Abdomen:   Soft, non-tender, nondistended, bowel sounds present  Extremities: Extremities normal, atraumatic, no  edema  Pulses: 2+ and symmetric    Lab Results: Recent Labs    03/14/20 1301 03/15/20 0530  NA 137 140  K 3.3* 3.8  CL 102 107  CO2 26 23  GLUCOSE 100* 108*  BUN 16 11  CREATININE 0.72 0.64  CALCIUM 8.8* 8.7*   Recent Labs    03/13/20 2147 03/14/20 1301  AST 117* 35  ALT 52* 38  ALKPHOS 67 56  BILITOT 0.4 0.6  PROT 7.4 6.6  ALBUMIN 4.0 3.7   Recent Labs    03/13/20 2147 03/13/20 2147 03/14/20 1301 03/15/20 0530  WBC 7.0   < > 6.0 5.9  NEUTROABS 3.4  --   --   --   HGB 12.0   < > 11.1* 10.7*  HCT 36.3   < > 33.5* 32.6*  MCV 89.6   < > 90.5 91.6  PLT 201   < > 194 171   < > = values in this interval not displayed.   Recent Labs    03/13/20 2149  LABPROT 12.2  INR 0.9      Assessment/Plan: -Coffee-ground emesis in setting of significant NSAID use.    Most likely gastritis versus ulcer disease.  Mild drop in hemoglobin noted. -S/p Nissen fundoplication. -Mild elevated LFTs.   Normal platelet count.  Resolved now.  Repeat LFTs normal.  Normal INR.  Ultrasound liver  showed fatty liver.  Recommendations --------------------------- -Proceed with EGD today  Risks (bleeding, infection, bowel perforation that could require surgery, sedation-related changes in cardiopulmonary systems), benefits (identification and possible treatment of source of symptoms, exclusion of certain causes of symptoms), and alternatives (watchful waiting, radiographic imaging studies, empiric medical treatment)  were explained to patient/family in detail and patient wishes to proceed.    Otis Brace MD, [place] 03/15/2020, 9:04 AM  Contact #  [phone]

## 2020-03-15 NOTE — Brief Op Note (Signed)
03/14/2020 - 03/15/2020  10:11 AM  PATIENT:  Sheila Rice  74 y.o. female  PRE-OPERATIVE DIAGNOSIS:  GI bleed  POST-OPERATIVE DIAGNOSIS:  esophagitis, esophageal ulcer, bx hpylori, gastric polyp, hiatal hernia   PROCEDURE:  Procedure(s): ESOPHAGOGASTRODUODENOSCOPY (EGD) WITH PROPOFOL (N/A) BIOPSY  SURGEON:  Surgeon(s) and Role:    * Maryiah Olvey, MD - Primary  Findings ---------- -EGD showed 2 clean-based superficial distal esophageal ulcers, large hiatal hernia with inflammation, mild gastritis.  No evidence of active bleeding.  Biopsies taken.  Recommendations ------------------------ -Start soft diet and advance as tolerated -Recommend pantoprazole 40 mg twice daily for 2 months -Recommend repeat EGD in 2 months to document healing of esophageal ulcers -Avoid NSAIDs -Okay to discharge from GI standpoint. -Follow-up with [name] in 6 weeks after discharge to arrange for repeat EGD -GI will sign off.  Call us back if needed  Otis Brace MD, [place] 03/15/2020, 10:12 AM  Contact #  [phone]

## 2020-03-15 NOTE — Op Note (Signed)
[facility] Patient Name: Sheila Rice Procedure Date: 03/15/2020 MRN: [id number] Attending MD: Otis Brace , MD Date of Birth: 1946/06/07 CSN: [id number] Age: 74 Admit Type: Inpatient Procedure:                Upper GI endoscopy Indications:              Coffee-ground emesis Providers:                Otis Brace, MD, Doristine Johns, RN, Cletis Athens, Technician Referring MD:              Medicines:                Sedation Administered by an Anesthesia Professional Complications:            No immediate complications. Estimated Blood Loss:     Estimated blood loss was minimal. Procedure:                Pre-Anesthesia Assessment:                           - Prior to the procedure, a History and Physical                            was performed, and patient medications and                            allergies were reviewed. The patient's tolerance of                            previous anesthesia was also reviewed. The risks                            and benefits of the procedure and the sedation                            options and risks were discussed with the patient.                            All questions were answered, and informed consent                            was obtained. Prior Anticoagulants: The patient has                            taken no previous anticoagulant or antiplatelet                            agents except for NSAID medication. ASA Grade                            Assessment: II - A patient with mild systemic  disease. After reviewing the risks and benefits,                            the patient was deemed in satisfactory condition to                            undergo the procedure.                           After obtaining informed consent, the endoscope was                            passed under direct vision. Throughout the                            procedure, the  patient's blood pressure, pulse, and                            oxygen saturations were monitored continuously. The                            GIF-H190 ([id number]) Olympus gastroscope was                            introduced through the mouth, and advanced to the                            second part of duodenum. The upper GI endoscopy was                            accomplished without difficulty. The patient                            tolerated the procedure well. Scope In: Scope Out: Findings:      Two superficial esophageal ulcers with no bleeding and no stigmata of       recent bleeding were found in the distal esophagus.      A large hiatal hernia was present with evidence of inflammation and       contact bleeding.      Scattered mild inflammation characterized by congestion (edema),       erosions and erythema was found in the gastric antrum. Biopsies were       taken with a cold forceps for histology.      A few small sessile polyps were found in the gastric body. Biopsies were       taken with a cold forceps for histology.      The cardia and gastric fundus were normal on retroflexion.      The duodenal bulb, first portion of the duodenum and second portion of       the duodenum were normal. Impression:               - Esophageal ulcers with no bleeding and no                            stigmata of recent bleeding.                           -  Large hiatal hernia.                           - Gastritis. Biopsied.                           - A few gastric polyps. Biopsied.                           - Normal duodenal bulb, first portion of the                            duodenum and second portion of the duodenum. Moderate Sedation:      Moderate (conscious) sedation was personally administered by an       anesthesia professional. The following parameters were monitored: oxygen       saturation, heart rate, blood pressure, and response to care. Recommendation:           -  Return patient to hospital ward for ongoing care.                           - Soft diet.                           - Continue present medications.                           - Await pathology results.                           - Repeat upper endoscopy in 2 months to check                            healing.                           - Return to GI office in 6 weeks.                           - Use Protonix (pantoprazole) 40 mg PO BID for 2                            months.                           - No aspirin, ibuprofen, naproxen, or other                            non-steroidal anti-inflammatory drugs. Procedure Code(s):        --- Professional ---                           [id number], Esophagogastroduodenoscopy, flexible,                            transoral; with biopsy, single or multiple Diagnosis Code(s):        --- Professional ---  K22.10, Ulcer of esophagus without bleeding                           K44.9, Diaphragmatic hernia without obstruction or                            gangrene                           K29.70, Gastritis, unspecified, without bleeding                           K31.7, Polyp of stomach and duodenum                           K92.0, Hematemesis CPT copyright 2019 American Medical Association. All rights reserved. The codes documented in this report are preliminary and upon coder review may  be revised to meet current compliance requirements. Otis Brace, MD Otis Brace, MD 03/15/2020 10:10:35 AM Number of Addenda: 0

## 2020-03-15 NOTE — Transfer of Care (Signed)
Immediate Anesthesia Transfer of Care Note  Patient: Sheila Rice  Procedure(s) Performed: ESOPHAGOGASTRODUODENOSCOPY (EGD) WITH PROPOFOL (N/A ) BIOPSY  Patient Location: Endoscopy Unit  Anesthesia Type:MAC  Level of Consciousness: awake, alert  and oriented  Airway & Oxygen Therapy: Patient Spontanous Breathing and Patient connected to face mask oxygen  Post-op Assessment: Report given to RN and Post -op Vital signs reviewed and stable  Post vital signs: Reviewed and stable  Last Vitals:  Vitals Value Taken Time  BP 114/67   Temp    Pulse 78   Resp 16   SpO2 100%     Last Pain:  Vitals:   03/15/20 0908  TempSrc: Oral  PainSc: 0-No pain         Complications: No complications documented.

## 2020-03-15 NOTE — Discharge Summary (Addendum)
Physician Discharge Summary  Sheila Rice XTG:[id number] DOB: 09/22/1945 DOA: 03/14/2020  PCP: [name], MD  Admit date: 03/14/2020 Discharge date: 03/15/2020  Time spent: 35 minutes  Recommendations for Outpatient Follow-up:  1. Recommend outpatient consideration for nonpharmacological modalities for arthritic pains--suggest maybe fish oil or flaxseed 2. Needs CBC Chem-12 1 week at PCP office 3. See below   Discharge Diagnoses:  Principal Problem:   Hematemesis Active Problems:   Hiatal hernia s/p lap repair & Niseen fundoplication 2/70/3500   GERD (gastroesophageal reflux disease)   Hypokalemia   Alcohol use   Arthritis pain   Discharge Condition: Improved  Diet recommendation: Soft diet on discharge  Filed Weights   03/13/20 2140 03/15/20 0908  Weight: 59 kg 59 kg    History of present illness:  30 white female [place] dwelling Hiatal hernia status post lap repair plus Nissen fundoplication 9/38/1829, HTN, reflux, prior spinal stenosis with facet arthropathy status post decompression L4-L5 with lateral mass fusion Presented to [facility] with 3 episodes of coffee-ground emesis 03/13/2020-takes BC powders/Goody powder X many years Initial hemoglobin 12 dropped to 11-she was hemodynamically stable started on Protonix gtt. kept n.p.o. and [facility] saw the patient in consult also was hypokalemic  Assessment & Plan:   Principal Problem:   Hematemesis Active Problems:   Hiatal hernia s/p lap repair & Niseen fundoplication 9/37/1696   GERD (gastroesophageal reflux disease)   Hypokalemia   Alcohol use   Arthritis pain   1. Likely upper GI bleed versus gastritis 1. Gastroenterologist Dr. [name] saw the patient in consult and performed EGD on 9/10 showing 2 clean-based ulcers superficial ulcers with no bleeding no stigmata of blood 2. Large hiatal hernia was also performed with scattered inflammation few sessile polyps were found as  well 3. Patient was encouraged to stay on a soft diet await pathology and get a repeat endoscopy in 2 months with Dr. Lu Duffel God and was cleared by Dr. [name] for discharge home 4. I have increased her Prilosec based on Dr. [name] suggestion to 40 twice daily and she will continue the same 2. Prior Nissen fundoplication with reflux and Barrett's esophagus 3. HTN 4. Prior low back pain status post lumbar decompression 2012 5. Occasional alcohol use 6. Deranged electrolytes on admission with hypokalemia    Her labs are stabilized during hospital stay We did suggest to her to consider getting outpatient modalities for treatment of her arthritis other than oral nonsteroidals and I have given her a prescription of diclofenac for her hands and her joints and shown her exercises for what looks like a de Quervain's tenosynovitis She will probably need a splint and further work-up in the outpatient setting if this persists but she is aware to follow-up with Dr. [name]  Discharge Exam: Vitals:   03/15/20 1010 03/15/20 1015  BP: (!) 119/45 (!) 153/57  Pulse: 70 68  Resp: 17 19  Temp:    SpO2: 98% 98%    General: Awake alert pleasant no distress EOMI looking younger than stated age  Cataract changes no icterus no pallor neck soft supple Cardiovascular: S1-S2 no murmur rub or gallop Respiratory: Clinically clear no added sound Swollen MCP on both hands with nodule  on left index finger She does have pain with [name] testing on the right side Neurologically intact otherwise  Discharge Instructions   Discharge Instructions    Diet - low sodium heart healthy   Complete by: As directed    Discharge instructions  Complete by: As directed    Please do not take any over-the-counter nonsteroidal medication or Goody powders Instead I would recommend you consider after discussion with Dr. [name] may be high-dose fish oil or using flaxseed for what looks like osteoarthritis of your fingers  from your many years of treatment I would recommend also that you take the Prilosec Twice a daily dosing and have a soft diet until this Sunday evening and then you can increase your diet to a regular 1 a voiding diary Apolonio Schneiders fibrous oily foods It would be a good idea to desist from alcohol until Sunday however then you should be able to resume the same You will need to follow-up with [name] in the outpatient setting with regards to further management of your ulcer disease and I do not think you will need a proton pump inhibitor long-term Make sure that you see Dr. [name] letter know that you were here in the hospital and she can guide you further with regards to your arthritis pain   Increase activity slowly   Complete by: As directed      Allergies as of 03/15/2020      Reactions   Ace Inhibitors Cough      Medication List    STOP taking these medications   BC HEADACHE POWDER PO   CALCIUM CITRATE PO   lidocaine 5 % Commonly known as: Lidoderm   nitroGLYCERIN 0.4 mg/hr patch Commonly known as: Nitro-Dur   terbinafine 250 MG tablet Commonly known as: LamISIL     TAKE these medications   acetaminophen 325 MG tablet Commonly known as: TYLENOL Take 2 tablets (650 mg total) by mouth every 6 (six) hours as needed for mild pain (or Fever >/= 101).   amLODipine 2.5 MG tablet Commonly known as: NORVASC Take 2.5 mg by mouth daily.   atorvastatin 20 MG tablet Commonly known as: LIPITOR Take 20 mg by mouth daily.   cyclobenzaprine 10 MG tablet Commonly known as: FLEXERIL Take 1 tablet (10 mg total) by mouth 2 (two) times daily as needed for muscle spasms.   diclofenac Sodium 1 % Gel Commonly known as: VOLTAREN Apply 2 g topically 4 (four) times daily.   esomeprazole 40 MG capsule Commonly known as: NEXIUM Take 1 capsule (40 mg total) by mouth 2 (two) times daily before a meal. What changed: when to take this   hydrochlorothiazide 25 MG tablet Commonly known as:  HYDRODIURIL Take 25 mg by mouth every morning.   losartan 100 MG tablet Commonly known as: COZAAR Take 100 mg by mouth daily.   polyvinyl alcohol 1.4 % ophthalmic solution Commonly known as: LIQUIFILM TEARS Place 1 drop into both eyes as needed for dry eyes.   VITAMIN D PO Take 1 capsule by mouth daily.      Allergies  Allergen Reactions  . Ace Inhibitors Cough    Follow-up Information    Clarene Essex, MD. Schedule an appointment as soon as possible for a visit in 6 week(s).   Specialty: Gastroenterology Why: Follow-up on esophageal ulcers Contact information: [address] [place] [address] [phone]                The results of significant diagnostics from this hospitalization (including imaging, microbiology, ancillary and laboratory) are listed below for reference.    Significant Diagnostic Studies: DG Abdomen Acute W/Chest  Result Date: 03/13/2020 CLINICAL DATA:  Nausea and vomiting with coffee ground emesis for 30 minutes. EXAM: DG ABDOMEN  ACUTE W/ 1V CHEST COMPARISON:  None. FINDINGS: Emphysematous changes in the lungs. Heart size and pulmonary vascularity are normal. No airspace disease or consolidation. No pleural effusions. No pneumothorax. There is an air-fluid level in the esophagus, possibly indicating reflux, dysmotility/achalasia, or esophageal obstruction. Calcification of the aorta. Scattered gas and stool throughout the colon. No small or large bowel distention. No free intra-abdominal air. No abnormal abdominal air-fluid levels. No radiopaque stones. Degenerative changes in the spine and hips. Old fracture deformity of the right pubic rami and symphysis pubis. Surgical screws in the lower lumbar spine. IMPRESSION: No evidence of active pulmonary disease. Air-fluid level in the esophagus, possibly indicating reflux, dysmotility/achalasia, or esophageal obstruction. Nonobstructive bowel gas pattern. Electronically Signed   By: Lucienne Capers M.D.   On: 03/13/2020 23:54   US ABDOMEN LIMITED RUQ  Result Date: 03/14/2020 CLINICAL DATA:  Elevated LFTs. EXAM: ULTRASOUND ABDOMEN LIMITED RIGHT UPPER QUADRANT COMPARISON:  Ultrasound 03/02/2016. FINDINGS: Gallbladder: No gallstones or wall thickening visualized. No sonographic Murphy sign noted by sonographer. Common bile duct: Diameter: 4.7 mm Liver: Heterogeneous echogenicity suggesting fatty infiltration or hepatocellular disease. No focal hepatic abnormality identified. Previously identified questionable hepatic lesions no longer identified. Portal vein is patent on color Doppler imaging with normal direction of blood flow towards the liver. Other: None. IMPRESSION: 1.  No gallstones or biliary distention. 2. Heterogeneous hepatic parenchymal pattern suggesting fatty infiltration or hepatocellular disease. Electronically Signed   By: Marcello Moores  Register   On: 03/14/2020 13:37    Microbiology: Recent Results (from the past 240 hour(s))  SARS Coronavirus 2 by RT PCR (hospital order, performed in [facility] hospital lab) Nasopharyngeal Nasopharyngeal Swab     Status: None   Collection Time: 03/14/20 12:18 AM   Specimen: Nasopharyngeal Swab  Result Value Ref Range Status   SARS Coronavirus 2 NEGATIVE NEGATIVE Final    Comment: (NOTE) SARS-CoV-2 target nucleic acids are NOT DETECTED.  The SARS-CoV-2 RNA is generally detectable in upper and lower respiratory specimens during the acute phase of infection. The lowest concentration of SARS-CoV-2 viral copies this assay can detect is 250 copies / mL. A negative result does not preclude SARS-CoV-2 infection and should not be used as the sole basis for treatment or other patient management decisions.  A negative result may occur with improper specimen collection / handling, submission of specimen other than nasopharyngeal swab, presence of viral mutation(s) within the areas targeted by this assay, and inadequate number of viral  copies (<250 copies / mL). A negative result must be combined with clinical observations, patient history, and epidemiological information.  Fact Sheet for Patients:   StrictlyIdeas.no  Fact Sheet for Healthcare Providers: BankingDealers.co.za  This test is not yet approved or  cleared by the [place] FDA and has been authorized for detection and/or diagnosis of SARS-CoV-2 by FDA under an Emergency Use Authorization (EUA).  This EUA will remain in effect (meaning this test can be used) for the duration of the COVID-19 declaration under Section 564(b)(1) of the Act, 21 U.S.C. section 360bbb-3(b)(1), unless the authorization is terminated or revoked sooner.  Performed at [facility], [address] [place], [place] [address]      Labs: Basic Metabolic Panel: Recent Labs  Lab 03/13/20 2147 03/14/20 1301 03/15/20 0530  NA 133* 137 140  K 3.5 3.3* 3.8  CL 96* 102 107  CO2 24 26 23   GLUCOSE 116* 100* 108*  BUN 27* 16 11  CREATININE 0.76 0.72 0.64  CALCIUM 9.1 8.8* 8.7*   Liver Function Tests: Recent Labs  Lab 03/13/20 2147 03/14/20 1301  AST 117* 35  ALT 52* 38  ALKPHOS 67 56  BILITOT 0.4 0.6  PROT 7.4 6.6  ALBUMIN 4.0 3.7   Recent Labs  Lab 03/13/20 2147  LIPASE 34   No results for input(s): AMMONIA in the last 168 hours. CBC: Recent Labs  Lab 03/13/20 2147 03/14/20 1301 03/15/20 0530  WBC 7.0 6.0 5.9  NEUTROABS 3.4  --   --   HGB 12.0 11.1* 10.7*  HCT 36.3 33.5* 32.6*  MCV 89.6 90.5 91.6  PLT 201 194 171   Cardiac Enzymes: No results for input(s): CKTOTAL, CKMB, CKMBINDEX, TROPONINI in the last 168 hours. BNP: BNP (last 3 results) No results for input(s): BNP in the last 8760 hours.  ProBNP (last 3 results) No results for input(s): PROBNP in the last 8760 hours.  CBG: No results for input(s): GLUCAP in the last 168 hours.     Signed:  Nita Sells MD   [facility] 03/15/2020, 10:45 AM

## 2020-03-18 ENCOUNTER — Other Ambulatory Visit: Payer: Self-pay

## 2020-03-18 LAB — SURGICAL PATHOLOGY

## 2020-03-19 ENCOUNTER — Encounter (HOSPITAL_COMMUNITY): Payer: Self-pay | Admitting: Gastroenterology

## 2020-03-28 DIAGNOSIS — E876 Hypokalemia: Secondary | ICD-10-CM | POA: Diagnosis not present

## 2020-03-28 DIAGNOSIS — Z8719 Personal history of other diseases of the digestive system: Secondary | ICD-10-CM | POA: Diagnosis not present

## 2020-03-28 DIAGNOSIS — Z23 Encounter for immunization: Secondary | ICD-10-CM | POA: Diagnosis not present

## 2020-04-12 DIAGNOSIS — Z23 Encounter for immunization: Secondary | ICD-10-CM | POA: Diagnosis not present

## 2020-04-18 DIAGNOSIS — I10 Essential (primary) hypertension: Secondary | ICD-10-CM | POA: Diagnosis not present

## 2020-04-18 DIAGNOSIS — E119 Type 2 diabetes mellitus without complications: Secondary | ICD-10-CM | POA: Diagnosis not present

## 2020-04-18 DIAGNOSIS — M810 Age-related osteoporosis without current pathological fracture: Secondary | ICD-10-CM | POA: Diagnosis not present

## 2020-04-18 DIAGNOSIS — E1169 Type 2 diabetes mellitus with other specified complication: Secondary | ICD-10-CM | POA: Diagnosis not present

## 2020-04-18 DIAGNOSIS — E7800 Pure hypercholesterolemia, unspecified: Secondary | ICD-10-CM | POA: Diagnosis not present

## 2020-05-01 DIAGNOSIS — K219 Gastro-esophageal reflux disease without esophagitis: Secondary | ICD-10-CM | POA: Diagnosis not present

## 2020-05-01 DIAGNOSIS — K2270 Barrett's esophagus without dysplasia: Secondary | ICD-10-CM | POA: Diagnosis not present

## 2020-05-01 DIAGNOSIS — K449 Diaphragmatic hernia without obstruction or gangrene: Secondary | ICD-10-CM | POA: Diagnosis not present

## 2020-05-06 ENCOUNTER — Telehealth: Payer: Self-pay | Admitting: Family Medicine

## 2020-05-06 NOTE — Telephone Encounter (Signed)
Patient states her ex-husband Hoy Finlay talked to you about her becoming your patient. Pleas advise

## 2020-05-06 NOTE — Telephone Encounter (Signed)
That's fine. Ty.

## 2020-05-07 NOTE — Telephone Encounter (Signed)
lvm to schedule appt.  

## 2020-05-13 DIAGNOSIS — E559 Vitamin D deficiency, unspecified: Secondary | ICD-10-CM | POA: Diagnosis not present

## 2020-05-13 DIAGNOSIS — M810 Age-related osteoporosis without current pathological fracture: Secondary | ICD-10-CM | POA: Diagnosis not present

## 2020-06-11 DIAGNOSIS — E1169 Type 2 diabetes mellitus with other specified complication: Secondary | ICD-10-CM | POA: Diagnosis not present

## 2020-06-11 DIAGNOSIS — E119 Type 2 diabetes mellitus without complications: Secondary | ICD-10-CM | POA: Diagnosis not present

## 2020-06-11 DIAGNOSIS — E7800 Pure hypercholesterolemia, unspecified: Secondary | ICD-10-CM | POA: Diagnosis not present

## 2020-06-11 DIAGNOSIS — K219 Gastro-esophageal reflux disease without esophagitis: Secondary | ICD-10-CM | POA: Diagnosis not present

## 2020-06-11 DIAGNOSIS — I10 Essential (primary) hypertension: Secondary | ICD-10-CM | POA: Diagnosis not present

## 2020-06-11 DIAGNOSIS — M810 Age-related osteoporosis without current pathological fracture: Secondary | ICD-10-CM | POA: Diagnosis not present

## 2020-07-09 ENCOUNTER — Other Ambulatory Visit: Payer: Self-pay

## 2020-07-10 ENCOUNTER — Encounter: Payer: Self-pay | Admitting: Family Medicine

## 2020-07-10 ENCOUNTER — Other Ambulatory Visit: Payer: Self-pay

## 2020-07-10 ENCOUNTER — Ambulatory Visit (INDEPENDENT_AMBULATORY_CARE_PROVIDER_SITE_OTHER): Payer: Medicare Other | Admitting: Family Medicine

## 2020-07-10 VITALS — BP 142/80 | HR 52 | Temp 98.1°F | Ht 60.5 in | Wt 132.0 lb

## 2020-07-10 DIAGNOSIS — M810 Age-related osteoporosis without current pathological fracture: Secondary | ICD-10-CM | POA: Diagnosis not present

## 2020-07-10 DIAGNOSIS — E785 Hyperlipidemia, unspecified: Secondary | ICD-10-CM | POA: Diagnosis not present

## 2020-07-10 DIAGNOSIS — I10 Essential (primary) hypertension: Secondary | ICD-10-CM

## 2020-07-10 MED ORDER — ALENDRONATE SODIUM 70 MG PO TABS: 70.0000 mg | ORAL_TABLET | ORAL | 11 refills | Status: DC

## 2020-07-10 NOTE — Patient Instructions (Signed)
Keep the diet clean and stay active.  Because your blood pressure is well-controlled, you no longer have to check your blood pressure at home anymore unless you wish. Some people check it twice daily every day and some people stop altogether. Either or anything in between is fine. Strong work!  Continue the calcium and Vit D supplementation.  Send a message if/when you need refills.  Let us know if you need anything.

## 2020-07-10 NOTE — Progress Notes (Signed)
Chief Complaint  Patient presents with  . New Patient (Initial Visit)       New Patient Visit SUBJECTIVE: HPI: Sheila Rice is an 75 y.o.female who is being seen for establishing care.  The patient was previously seen at [facility].  Hypertension Patient presents for hypertension follow up. She does monitor home blood pressures. Blood pressures ranging on average from 140's/70's. She is compliant with medications- HCTZ 25 mg/d, Norvasc 2.5 mg/d. Patient has these side effects of medication: none She is usually adhering to a healthy diet overall. Exercise: active around house No CP or SOB.   Hyperlipidemia Patient presents for hyperlipidemia follow up. Currently being treated with Lipitor 20 mg/d and compliance with treatment thus far has been good. She denies myalgias. Diet/exercise as above.  The patient is not known to have coexisting coronary artery disease.  Osteoporosis Takes 1200 mg of Ca daily and 2000 units of Vit D daily. Stays active. Takes Fosamax 70 mg weekly. Fx'd pelvis around 10 years ago. She fell while walking dog and got tripped up.   Past Medical History:  Diagnosis Date  . GERD (gastroesophageal reflux disease)   . Hypertension    Past Surgical History:  Procedure Laterality Date  . BACK SURGERY     Spinal Stenosis  . BIOPSY  03/15/2020   Procedure: BIOPSY;  Surgeon: Kathi Der, MD;  Location: [facility];  Service: Gastroenterology;;  . [place]butus Leas    . ESOPHAGOGASTRODUODENOSCOPY (EGD) WITH PROPOFOL N/A 03/15/2020   Procedure: ESOPHAGOGASTRODUODENOSCOPY (EGD) WITH PROPOFOL;  Surgeon: Kathi Der, MD;  Location: [facility];  Service: Gastroenterology;  Laterality: N/A;  . LAPAROSCOPIC NISSEN FUNDOPLICATION N/A 01/15/2017   Procedure: LAPAROSCOPIC NISSEN FUNDOPLICATION and HIATAL HERNIA REPAIR;  Surgeon: Avel Peace, MD;  Location: [facility];  Service: General;  Laterality: N/A;   History reviewed. No pertinent family history. Allergies   Allergen Reactions  . Ace Inhibitors Cough    Current Outpatient Medications:  .  acetaminophen (TYLENOL) 325 MG tablet, Take 2 tablets (650 mg total) by mouth every 6 (six) hours as needed for mild pain (or Fever >/= 101)., Disp: , Rfl:  .  alendronate (FOSAMAX) 70 MG tablet, Take 1 tablet (70 mg total) by mouth every 7 (seven) days. Take with a full glass of water on an empty stomach., Disp: 4 tablet, Rfl: 11 .  amLODipine (NORVASC) 2.5 MG tablet, Take 2.5 mg by mouth daily., Disp: , Rfl:  .  atorvastatin (LIPITOR) 20 MG tablet, Take 20 mg by mouth daily., Disp: , Rfl:  .  diclofenac Sodium (VOLTAREN) 1 % GEL, Apply 2 g topically 4 (four) times daily., Disp: 100 g, Rfl: 2 .  esomeprazole (NEXIUM) 40 MG capsule, Take 1 capsule (40 mg total) by mouth 2 (two) times daily before a meal., Disp: 60 capsule, Rfl: 2 .  hydrochlorothiazide (HYDRODIURIL) 25 MG tablet, Take 25 mg by mouth every morning., Disp: , Rfl:  .  losartan (COZAAR) 100 MG tablet, Take 100 mg by mouth daily., Disp: , Rfl:  .  polyvinyl alcohol (LIQUIFILM TEARS) 1.4 % ophthalmic solution, Place 1 drop into both eyes as needed for dry eyes., Disp: , Rfl:  .  VITAMIN D PO, Take 1 capsule by mouth daily., Disp: , Rfl:   OBJECTIVE: BP (!) 142/80 (BP Location: Left Arm, Patient Position: Sitting, Cuff Size: Normal)   Pulse (!) 52   Temp 98.1 F (36.7 C) (Oral)   Ht 5' 0.5" (1.537 m)   Wt 132 lb (59.9 kg)  SpO2 96%   BMI 25.36 kg/m  General:  well developed, well nourished, in no apparent distress Skin:  no significant moles, warts, or growths Throat/Pharynx:  lips and gingiva without lesion; tongue and uvula midline; non-inflamed pharynx; no exudates or postnasal drainage Lungs:  clear to auscultation, breath sounds equal bilaterally, no respiratory distress Cardio:  regular rate and rhythm, no LE edema or bruits Musculoskeletal:  symmetrical muscle groups noted without atrophy or deformity Neuro:  gait normal Psych: well  oriented with normal range of affect and appropriate judgment/insight  ASSESSMENT/PLAN: Essential hypertension  Hyperlipidemia, unspecified hyperlipidemia type  Age-related osteoporosis without current pathological fracture  Patient instructed to sign release of records form from their previous PCP. 1. Cont Norvasc 2.5 mg/d and HCTZ 25 mg/d. Can stop ck'ing BP at home. Counseled on diet/exercise. 2. Cont Lipitor 20 mg/d.  3. Cont Fosamax 70 mg/week. Cont Ca and Vit D supp. Cont wt bearing activity.  Patient should return in 6 mo for med ck or prn. The patient voiced understanding and agreement to the plan.   [place], DO 07/10/20  9:16 AM

## 2020-08-19 MED ORDER — AMLODIPINE BESYLATE 2.5 MG PO TABS
2.5000 mg | ORAL_TABLET | Freq: Every day | ORAL | 1 refills | Status: DC
Start: 2020-08-19 — End: 2021-02-18

## 2020-08-19 MED ORDER — HYDROCHLOROTHIAZIDE 25 MG PO TABS: 25.0000 mg | ORAL_TABLET | Freq: Every morning | ORAL | 1 refills | Status: DC

## 2020-09-06 MED ORDER — LOSARTAN POTASSIUM 100 MG PO TABS
100.0000 mg | ORAL_TABLET | Freq: Every day | ORAL | 1 refills | Status: DC
Start: 2020-09-06 — End: 2021-10-06

## 2020-09-06 NOTE — Addendum Note (Signed)
Addended byDamita Dunnings D on: 09/06/2020 10:26 AM   Modules accepted: Orders

## 2020-12-18 MED ORDER — ATORVASTATIN CALCIUM 20 MG PO TABS
20.0000 mg | ORAL_TABLET | Freq: Every day | ORAL | 1 refills | Status: DC
Start: 2020-12-18 — End: 2021-01-07

## 2020-12-18 MED ORDER — ALENDRONATE SODIUM 70 MG PO TABS
70.0000 mg | ORAL_TABLET | ORAL | 3 refills | Status: DC
Start: 2020-12-18 — End: 2022-07-13

## 2020-12-19 DIAGNOSIS — Z23 Encounter for immunization: Secondary | ICD-10-CM | POA: Diagnosis not present

## 2021-01-07 ENCOUNTER — Ambulatory Visit (INDEPENDENT_AMBULATORY_CARE_PROVIDER_SITE_OTHER): Payer: Medicare Other | Admitting: Family Medicine

## 2021-01-07 ENCOUNTER — Other Ambulatory Visit: Payer: Self-pay

## 2021-01-07 ENCOUNTER — Encounter: Payer: Self-pay | Admitting: Family Medicine

## 2021-01-07 ENCOUNTER — Other Ambulatory Visit: Payer: Self-pay | Admitting: Family Medicine

## 2021-01-07 VITALS — BP 142/82 | HR 72 | Temp 98.3°F | Ht 60.0 in | Wt 131.2 lb

## 2021-01-07 DIAGNOSIS — E785 Hyperlipidemia, unspecified: Secondary | ICD-10-CM | POA: Diagnosis not present

## 2021-01-07 DIAGNOSIS — I10 Essential (primary) hypertension: Secondary | ICD-10-CM | POA: Diagnosis not present

## 2021-01-07 DIAGNOSIS — M810 Age-related osteoporosis without current pathological fracture: Secondary | ICD-10-CM

## 2021-01-07 DIAGNOSIS — E870 Hyperosmolality and hypernatremia: Secondary | ICD-10-CM

## 2021-01-07 LAB — COMPREHENSIVE METABOLIC PANEL
ALT: 19 U/L (ref 0–35)
AST: 20 U/L (ref 0–37)
Albumin: 4.2 g/dL (ref 3.5–5.2)
Alkaline Phosphatase: 58 U/L (ref 39–117)
BUN: 15 mg/dL (ref 6–23)
CO2: 26 mEq/L (ref 19–32)
Calcium: 9.4 mg/dL (ref 8.4–10.5)
Chloride: 94 mEq/L — ABNORMAL LOW (ref 96–112)
Creatinine, Ser: 0.73 mg/dL (ref 0.40–1.20)
GFR: 79.42 mL/min (ref >60.00)
Glucose, Bld: 98 mg/dL (ref 70–99)
Potassium: 4.2 mEq/L (ref 3.5–5.1)
Sodium: 130 mEq/L — ABNORMAL LOW (ref 135–145)
Total Bilirubin: 0.7 mg/dL (ref 0.2–1.2)
Total Protein: 6.9 g/dL (ref 6.0–8.3)

## 2021-01-07 LAB — LIPID PANEL
Cholesterol: 245 mg/dL — ABNORMAL HIGH (ref 0–200)
HDL: 84.3 mg/dL (ref >39.00)
LDL Cholesterol: 144 mg/dL — ABNORMAL HIGH (ref 0–99)
NonHDL: 160.53
Total CHOL/HDL Ratio: 3
Triglycerides: 82 mg/dL (ref 0.0–149.0)
VLDL: 16.4 mg/dL (ref 0.0–40.0)

## 2021-01-07 LAB — VITAMIN D 25 HYDROXY (VIT D DEFICIENCY, FRACTURES): VITD: 35.5 ng/mL (ref 30.00–100.00)

## 2021-01-07 MED ORDER — ZOLPIDEM TARTRATE 5 MG PO TABS: 2.5000 mg | ORAL_TABLET | Freq: Every evening | ORAL | 0 refills | Status: DC | PRN

## 2021-01-07 MED ORDER — ATORVASTATIN CALCIUM 40 MG PO TABS: 40.0000 mg | ORAL_TABLET | Freq: Every day | ORAL | 3 refills | Status: DC

## 2021-01-07 NOTE — Progress Notes (Signed)
Chief Complaint  Patient presents with   Follow-up    Subjective Sheila Rice is a 75 y.o. female who presents for hypertension follow up. She does not monitor home blood pressures. She is compliant with medications-  HCTZ 25 mg/d, losartan 100 mg/d, Norvasc 2.5 mg/d. Patient has these side effects of medication: none She is adhering to a healthy diet overall. Current exercise: active in yard No cp or sob.  Hyperlipidemia Patient presents for dyslipidemia follow up. Currently being treated with Lipitor 20 mg/d and compliance with treatment thus far has been good. She denies myalgias. Diet/exercise as above.  The patient is not known to have coexisting coronary artery disease.  Osteoporosis Taking Fosamax 70 mg/week whic she has been on for around a year. Compliant, no AE's. Exercise as above. No recent fractures. Taking 1200 mg Ca and 2000 u Vit D qd.    Past Medical History:  Diagnosis Date   GERD (gastroesophageal reflux disease)    Hypertension     Exam BP (!) 142/82   Pulse 72   Temp 98.3 F (36.8 C) (Oral)   Ht 5' (1.524 m)   Wt 131 lb 4 oz (59.5 kg)   SpO2 97%   BMI 25.63 kg/m  General:  well developed, well nourished, in no apparent distress Heart: RRR, no bruits, no LE edema Lungs: clear to auscultation, no accessory muscle use Psych: well oriented with normal range of affect and appropriate judgment/insight  Essential hypertension  Hyperlipidemia, unspecified hyperlipidemia type - Plan: Comprehensive metabolic panel, Lipid panel  Age-related osteoporosis without current pathological fracture - Plan: VITAMIN D 25 Hydroxy (Vit-D Deficiency, Fractures)  Chronic, stable. Cont Norvasc 2.5 mg/d, losartan 100 mg/d, HCTZ 25 mg/d. Counseled on diet and exercise. Chronic, stable. Cont Lipitor 20 mg/d.  Chronic, stable. Cont Fosamax 70 mg/week. On year 1/5. Cont Vit D and Ca.  F/u in 6 mo or prn. The patient voiced understanding and agreement to the  plan.  [place], DO 01/07/21  11:32 AM

## 2021-01-07 NOTE — Patient Instructions (Addendum)
Give [place] 2-3 business days to get the results of your labs back.   Keep the diet clean and stay active.  Continue with calcium and Vit D.   Call your pharmacy regarding the final Shingrix vaccine.   Let us know if you need anything.

## 2021-01-07 NOTE — Progress Notes (Signed)
Bmp

## 2021-01-08 ENCOUNTER — Other Ambulatory Visit (INDEPENDENT_AMBULATORY_CARE_PROVIDER_SITE_OTHER): Payer: Medicare Other

## 2021-01-08 DIAGNOSIS — E870 Hyperosmolality and hypernatremia: Secondary | ICD-10-CM

## 2021-01-08 LAB — BASIC METABOLIC PANEL
BUN: 18 mg/dL (ref 6–23)
CO2: 27 mEq/L (ref 19–32)
Calcium: 9.3 mg/dL (ref 8.4–10.5)
Chloride: 92 mEq/L — ABNORMAL LOW (ref 96–112)
Creatinine, Ser: 0.74 mg/dL (ref 0.40–1.20)
GFR: 78.13 mL/min (ref >60.00)
Glucose, Bld: 92 mg/dL (ref 70–99)
Potassium: 4.2 mEq/L (ref 3.5–5.1)
Sodium: 130 mEq/L — ABNORMAL LOW (ref 135–145)

## 2021-01-09 ENCOUNTER — Other Ambulatory Visit: Payer: Self-pay | Admitting: Family Medicine

## 2021-01-09 DIAGNOSIS — E871 Hypo-osmolality and hyponatremia: Secondary | ICD-10-CM

## 2021-02-18 ENCOUNTER — Other Ambulatory Visit: Payer: Self-pay | Admitting: Family Medicine

## 2021-02-18 ENCOUNTER — Other Ambulatory Visit (INDEPENDENT_AMBULATORY_CARE_PROVIDER_SITE_OTHER): Payer: Medicare Other

## 2021-02-18 ENCOUNTER — Other Ambulatory Visit: Payer: Self-pay

## 2021-02-18 DIAGNOSIS — E785 Hyperlipidemia, unspecified: Secondary | ICD-10-CM

## 2021-02-18 DIAGNOSIS — E871 Hypo-osmolality and hyponatremia: Secondary | ICD-10-CM | POA: Diagnosis not present

## 2021-02-18 LAB — COMPREHENSIVE METABOLIC PANEL
ALT: 22 U/L (ref 0–35)
AST: 27 U/L (ref 0–37)
Albumin: 4 g/dL (ref 3.5–5.2)
Alkaline Phosphatase: 60 U/L (ref 39–117)
BUN: 9 mg/dL (ref 6–23)
CO2: 29 mEq/L (ref 19–32)
Calcium: 9.3 mg/dL (ref 8.4–10.5)
Chloride: 95 mEq/L — ABNORMAL LOW (ref 96–112)
Creatinine, Ser: 0.65 mg/dL (ref 0.40–1.20)
GFR: 84.96 mL/min (ref >60.00)
Glucose, Bld: 100 mg/dL — ABNORMAL HIGH (ref 70–99)
Potassium: 4.4 mEq/L (ref 3.5–5.1)
Sodium: 131 mEq/L — ABNORMAL LOW (ref 135–145)
Total Bilirubin: 0.5 mg/dL (ref 0.2–1.2)
Total Protein: 6.6 g/dL (ref 6.0–8.3)

## 2021-02-18 LAB — LIPID PANEL
Cholesterol: 174 mg/dL (ref 0–200)
HDL: 84.9 mg/dL (ref >39.00)
LDL Cholesterol: 74 mg/dL (ref 0–99)
NonHDL: 88.67
Total CHOL/HDL Ratio: 2
Triglycerides: 73 mg/dL (ref 0.0–149.0)
VLDL: 14.6 mg/dL (ref 0.0–40.0)

## 2021-02-20 LAB — OSMOLALITY: Osmolality: 269 mOsm/kg — ABNORMAL LOW (ref 278–305)

## 2021-02-20 LAB — SODIUM, URINE, RANDOM: Sodium, Ur: 88 mmol/L (ref 28–272)

## 2021-02-20 LAB — OSMOLALITY, URINE: Osmolality, Ur: 294 mOsm/kg (ref 50–1200)

## 2021-02-21 ENCOUNTER — Other Ambulatory Visit: Payer: Self-pay | Admitting: Family Medicine

## 2021-02-21 DIAGNOSIS — E871 Hypo-osmolality and hyponatremia: Secondary | ICD-10-CM

## 2021-03-05 DIAGNOSIS — K2270 Barrett's esophagus without dysplasia: Secondary | ICD-10-CM | POA: Diagnosis not present

## 2021-03-05 DIAGNOSIS — K219 Gastro-esophageal reflux disease without esophagitis: Secondary | ICD-10-CM | POA: Diagnosis not present

## 2021-03-07 ENCOUNTER — Other Ambulatory Visit (INDEPENDENT_AMBULATORY_CARE_PROVIDER_SITE_OTHER): Payer: Medicare Other

## 2021-03-07 ENCOUNTER — Other Ambulatory Visit: Payer: Self-pay

## 2021-03-07 DIAGNOSIS — E871 Hypo-osmolality and hyponatremia: Secondary | ICD-10-CM | POA: Diagnosis not present

## 2021-03-07 LAB — BASIC METABOLIC PANEL
BUN: 14 mg/dL (ref 6–23)
CO2: 27 mEq/L (ref 19–32)
Calcium: 9.4 mg/dL (ref 8.4–10.5)
Chloride: 98 mEq/L (ref 96–112)
Creatinine, Ser: 0.65 mg/dL (ref 0.40–1.20)
GFR: 84.93 mL/min (ref >60.00)
Glucose, Bld: 99 mg/dL (ref 70–99)
Potassium: 4.5 mEq/L (ref 3.5–5.1)
Sodium: 134 mEq/L — ABNORMAL LOW (ref 135–145)

## 2021-03-28 ENCOUNTER — Encounter: Payer: Self-pay | Admitting: Family Medicine

## 2021-03-28 ENCOUNTER — Telehealth (INDEPENDENT_AMBULATORY_CARE_PROVIDER_SITE_OTHER): Payer: Medicare Other | Admitting: Family Medicine

## 2021-03-28 ENCOUNTER — Other Ambulatory Visit: Payer: Self-pay

## 2021-03-28 DIAGNOSIS — G2581 Restless legs syndrome: Secondary | ICD-10-CM

## 2021-03-28 DIAGNOSIS — D649 Anemia, unspecified: Secondary | ICD-10-CM | POA: Diagnosis not present

## 2021-03-28 DIAGNOSIS — E871 Hypo-osmolality and hyponatremia: Secondary | ICD-10-CM

## 2021-03-28 MED ORDER — ROPINIROLE HCL 1 MG PO TABS: 1.0000 mg | ORAL_TABLET | Freq: Every day | ORAL | 2 refills | Status: DC

## 2021-03-28 NOTE — Progress Notes (Signed)
Chief Complaint  Patient presents with   Leg Pain    Restless     Subjective: Patient is a 75 y.o. female here for RLS. Due to COVID-19 pandemic, we are interacting via telephone. I verified patient's ID using 2 identifiers. Patient agreed to proceed with visit via this method. Patient is at home, I am at office. Patient and I are present for visit.   Patient has been having worsening RLS symptoms over the past few months.  It starts in the early evening around 6 PM and has woken her up as late as 4 AM.  She does not have it during the day.  She has associated leg cramps which got worse after I told her to stop drinking so much fluids after her sodium got low.  She cut back on tea but still drinks around 2 bottles of water daily which is enough to stave off the cramps.  She has never been on anything before.  She has had 2 husbands and neither did well with gabapentin so she would like to stay away from that.  Past Medical History:  Diagnosis Date   GERD (gastroesophageal reflux disease)    Hypertension     Objective: No conversational dyspnea Age appropriate judgment and insight Nml affect and mood  Assessment and Plan: RLS (restless legs syndrome) - Plan: rOPINIRole (REQUIP) 1 MG tablet  Start Requip 1 mg in the evening.  We will have her come back to check her sodium and iron levels next month.  I will see her as originally scheduled unless she is not doing well in which case I will see her next month. Total time: 11 minutes The patient voiced understanding and agreement to the plan.  [place], DO 03/28/21  11:54 AM

## 2021-04-25 DIAGNOSIS — R197 Diarrhea, unspecified: Secondary | ICD-10-CM | POA: Diagnosis not present

## 2021-05-08 DIAGNOSIS — Z23 Encounter for immunization: Secondary | ICD-10-CM | POA: Diagnosis not present

## 2021-05-17 ENCOUNTER — Other Ambulatory Visit: Payer: Self-pay | Admitting: Family Medicine

## 2021-05-23 ENCOUNTER — Other Ambulatory Visit: Payer: Self-pay | Admitting: Family Medicine

## 2021-06-17 ENCOUNTER — Other Ambulatory Visit: Payer: Self-pay | Admitting: Family Medicine

## 2021-06-17 DIAGNOSIS — G2581 Restless legs syndrome: Secondary | ICD-10-CM

## 2021-07-11 ENCOUNTER — Ambulatory Visit (INDEPENDENT_AMBULATORY_CARE_PROVIDER_SITE_OTHER): Payer: Medicare Other | Admitting: Family Medicine

## 2021-07-11 ENCOUNTER — Encounter: Payer: Self-pay | Admitting: Family Medicine

## 2021-07-11 VITALS — BP 138/78 | HR 68 | Temp 98.0°F | Ht 60.5 in | Wt 129.0 lb

## 2021-07-11 DIAGNOSIS — M810 Age-related osteoporosis without current pathological fracture: Secondary | ICD-10-CM

## 2021-07-11 DIAGNOSIS — E785 Hyperlipidemia, unspecified: Secondary | ICD-10-CM

## 2021-07-11 DIAGNOSIS — I10 Essential (primary) hypertension: Secondary | ICD-10-CM | POA: Diagnosis not present

## 2021-07-11 DIAGNOSIS — G2581 Restless legs syndrome: Secondary | ICD-10-CM

## 2021-07-11 DIAGNOSIS — Z23 Encounter for immunization: Secondary | ICD-10-CM

## 2021-07-11 LAB — COMPREHENSIVE METABOLIC PANEL
ALT: 21 U/L (ref 0–35)
AST: 24 U/L (ref 0–37)
Albumin: 4.1 g/dL (ref 3.5–5.2)
Alkaline Phosphatase: 63 U/L (ref 39–117)
BUN: 17 mg/dL (ref 6–23)
CO2: 29 mEq/L (ref 19–32)
Calcium: 9.5 mg/dL (ref 8.4–10.5)
Chloride: 93 mEq/L — ABNORMAL LOW (ref 96–112)
Creatinine, Ser: 0.76 mg/dL (ref 0.40–1.20)
GFR: 75.4 mL/min (ref >60.00)
Glucose, Bld: 100 mg/dL — ABNORMAL HIGH (ref 70–99)
Potassium: 4.3 mEq/L (ref 3.5–5.1)
Sodium: 130 mEq/L — ABNORMAL LOW (ref 135–145)
Total Bilirubin: 0.7 mg/dL (ref 0.2–1.2)
Total Protein: 6.7 g/dL (ref 6.0–8.3)

## 2021-07-11 LAB — LIPID PANEL
Cholesterol: 259 mg/dL — ABNORMAL HIGH (ref 0–200)
HDL: 87.8 mg/dL (ref >39.00)
LDL Cholesterol: 157 mg/dL — ABNORMAL HIGH (ref 0–99)
NonHDL: 171.26
Total CHOL/HDL Ratio: 3
Triglycerides: 72 mg/dL (ref 0.0–149.0)
VLDL: 14.4 mg/dL (ref 0.0–40.0)

## 2021-07-11 LAB — VITAMIN D 25 HYDROXY (VIT D DEFICIENCY, FRACTURES): VITD: 29.42 ng/mL — ABNORMAL LOW (ref 30.00–100.00)

## 2021-07-11 MED ORDER — ROPINIROLE HCL 2 MG PO TABS: 2.0000 mg | ORAL_TABLET | Freq: Every day | ORAL | 2 refills | Status: DC

## 2021-07-11 NOTE — Patient Instructions (Signed)
If you are interested in getting the 2nd Shingrix, please call the pharmacy to inquire about this.  Take 1200 mg of calcium daily and at least 1000 units of vitamin D3 daily.   Keep the diet clean and stay active.  Give [place] 2-3 business days to get the results of your labs back.   Let us know if you need anything.

## 2021-07-11 NOTE — Progress Notes (Signed)
Chief Complaint  Patient presents with   Follow-up    Subjective Sheila Rice is a 76 y.o. female who presents for hypertension follow up. She does not monitor home blood pressures. She is compliant with medications- Norvasc 2.5 mg/d, HCTZ 25 mg/d, losartan 100 mg/d. Patient has these side effects of medication: none She is adhering to a healthy diet overall. Current exercise: active at home No CP or SOB.  Hyperlipidemia Patient presents for dyslipidemia follow up. Currently being treated with Lipitor 40 mg/d and compliance with treatment thus far has been good. She denies myalgias. Diet/exercise as above.  The patient is not known to have coexisting coronary artery disease.  RLS Taking Requip 1 mg nightly.  She reports compliance with no adverse effects.  This works well unless her symptoms start closer to dinnertime.  She is wondering if she can increase her dosages to 2 mg nightly.  Osteoporosis Patient with a history of osteoporosis on Fosamax 70 mg weekly.  Exercise as above.  She sometimes remembers to take calcium and vitamin D.  She does not like taking a pill.  Past Medical History:  Diagnosis Date   GERD (gastroesophageal reflux disease)    Hypertension     Exam BP 138/78    Pulse 68    Temp 98 F (36.7 C) (Oral)    Ht 5' 0.5" (1.537 m)    Wt 129 lb (58.5 kg)    SpO2 99%    BMI 24.78 kg/m  General:  well developed, well nourished, in no apparent distress Heart: RRR, no bruits, no LE edema Lungs: clear to auscultation, no accessory muscle use Psych: well oriented with normal range of affect and appropriate judgment/insight  Essential hypertension  Hyperlipidemia, unspecified hyperlipidemia type - Plan: Comprehensive metabolic panel, Lipid panel  RLS (restless legs syndrome)  Age-related osteoporosis without current pathological fracture - Plan: VITAMIN D 25 Hydroxy (Vit-D Deficiency, Fractures)  Need for vaccination against Streptococcus pneumoniae - Plan:  Pneumococcal conjugate vaccine 20-valent (Prevnar 20)  Chronic, stable.  Continue Norvasc 2.5 mg daily, hydrochlorothiazide 25 mg daily, losartan 100 mg daily.  Counseled on diet and exercise. Chronic, stable.  Check labs today.  Continue Lipitor 40 mg daily. Chronic, overall stable.  Requip 2 mg nightly. Chronic, stable.  Needs to remember to take daily calcium and vitamin D.  We will follow-up on vitamin D levels.  Counseled on exercise.  Continue Fosamax 70 mg weekly. Update pneumonia vaccine today.  Second Shingrix recommended but she would have to contact the pharmacy for this. F/u in 6 mo or prn. The patient voiced understanding and agreement to the plan.  [place], DO 07/11/21  11:41 AM

## 2021-07-14 ENCOUNTER — Other Ambulatory Visit: Payer: Self-pay | Admitting: Family Medicine

## 2021-07-14 DIAGNOSIS — E871 Hypo-osmolality and hyponatremia: Secondary | ICD-10-CM

## 2021-07-28 ENCOUNTER — Other Ambulatory Visit (INDEPENDENT_AMBULATORY_CARE_PROVIDER_SITE_OTHER): Payer: Medicare Other

## 2021-07-28 DIAGNOSIS — E871 Hypo-osmolality and hyponatremia: Secondary | ICD-10-CM | POA: Diagnosis not present

## 2021-07-28 LAB — BASIC METABOLIC PANEL
BUN: 15 mg/dL (ref 6–23)
CO2: 28 mEq/L (ref 19–32)
Calcium: 9.2 mg/dL (ref 8.4–10.5)
Chloride: 102 mEq/L (ref 96–112)
Creatinine, Ser: 0.75 mg/dL (ref 0.40–1.20)
GFR: 76.59 mL/min (ref >60.00)
Glucose, Bld: 94 mg/dL (ref 70–99)
Potassium: 4.3 mEq/L (ref 3.5–5.1)
Sodium: 139 mEq/L (ref 135–145)

## 2021-09-03 ENCOUNTER — Other Ambulatory Visit: Payer: Self-pay | Admitting: Family Medicine

## 2021-10-06 ENCOUNTER — Other Ambulatory Visit: Payer: Self-pay | Admitting: Family Medicine

## 2021-10-30 ENCOUNTER — Encounter: Payer: Self-pay | Admitting: Family Medicine

## 2021-10-30 NOTE — Telephone Encounter (Signed)
Patient scheduled for Monday.

## 2021-11-03 ENCOUNTER — Encounter: Payer: Self-pay | Admitting: Family Medicine

## 2021-11-03 ENCOUNTER — Ambulatory Visit (INDEPENDENT_AMBULATORY_CARE_PROVIDER_SITE_OTHER): Payer: Medicare Other | Admitting: Family Medicine

## 2021-11-03 VITALS — BP 120/72 | HR 69 | Temp 98.2°F | Ht 60.0 in | Wt 131.0 lb

## 2021-11-03 DIAGNOSIS — Z8639 Personal history of other endocrine, nutritional and metabolic disease: Secondary | ICD-10-CM | POA: Diagnosis not present

## 2021-11-03 DIAGNOSIS — E559 Vitamin D deficiency, unspecified: Secondary | ICD-10-CM

## 2021-11-03 DIAGNOSIS — R5383 Other fatigue: Secondary | ICD-10-CM

## 2021-11-03 DIAGNOSIS — K22719 Barrett's esophagus with dysplasia, unspecified: Secondary | ICD-10-CM | POA: Diagnosis not present

## 2021-11-03 DIAGNOSIS — M4003 Postural kyphosis, cervicothoracic region: Secondary | ICD-10-CM

## 2021-11-03 NOTE — Patient Instructions (Addendum)
Give [place] 2-3 business days to get the results of your labs back.  ? ?The Shingrix vaccine (for shingles) is a 2 shot series spaced 2-6 months apart. It can make people feel low energy, achy and almost like they have the flu for 48 hours after injection. 1/5 people can have nausea and/or vomiting. Please plan accordingly when deciding on when to get this shot. Call your pharmacy to get this. The second shot of the series is less severe regarding the side effects, but it still lasts 48 hours.  ? ?If you do not hear anything about your referral in the next 1-2 weeks, call our office and ask for an update. ? ?Let us know if you need anything. ? ?EXERCISES ?RANGE OF MOTION (ROM) AND STRETCHING EXERCISES  ?These exercises may help you when beginning to rehabilitate your issue. In order to successfully resolve your symptoms, you must improve your posture. These exercises are designed to help reduce the forward-head and rounded-shoulder posture which contributes to this condition. Your symptoms may resolve with or without further involvement from your physician, physical therapist or athletic trainer. While completing these exercises, remember:  ?Restoring tissue flexibility helps normal motion to return to the joints. This allows healthier, less painful movement and activity. ?An effective stretch should be held for at least 20 seconds, although you may need to begin with shorter hold times for comfort. ?A stretch should never be painful. You should only feel a gentle lengthening or release in the stretched tissue. ?Do not do any stretch or exercise that you cannot tolerate. ? ?STRETCH- Axial Extensors ?Lie on your back on the floor. You may bend your knees for comfort. Place a rolled-up hand towel or dish towel, about 2 inches in diameter, under the part of your head that makes contact with the floor. ?Gently tuck your chin, as if trying to make a "double chin," until you feel a gentle stretch at the base of your  head. ?Hold 15-20 seconds. ?Repeat 2-3 times. Complete this exercise 1 time per day.  ? ?STRETCH - Axial Extension  ?Stand or sit on a firm surface. Assume a good posture: chest up, shoulders drawn back, abdominal muscles slightly tense, knees unlocked (if standing) and feet hip width apart. ?Slowly retract your chin so your head slides back and your chin slightly lowers. Continue to look straight ahead. ?You should feel a gentle stretch in the back of your head. Be certain not to feel an aggressive stretch since this can cause headaches later. ?Hold for 15-20 seconds. ?Repeat 2-3 times. Complete this exercise 1 time per day. ? ?STRETCH - Cervical Side Bend  ?Stand or sit on a firm surface. Assume a good posture: chest up, shoulders drawn back, abdominal muscles slightly tense, knees unlocked (if standing) and feet hip width apart. ?Without letting your nose or shoulders move, slowly tip your right / left ear to your shoulder until your feel a gentle stretch in the muscles on the opposite side of your neck. ?Hold 15-20 seconds. ?Repeat 2-3 times. Complete this exercise 1-2 times per day. ? ?STRETCH - Cervical Rotators  ?Stand or sit on a firm surface. Assume a good posture: chest up, shoulders drawn back, abdominal muscles slightly tense, knees unlocked (if standing) and feet hip width apart. ?Keeping your eyes level with the ground, slowly turn your head until you feel a gentle stretch along the back and opposite side of your neck. ?Hold 15-20 seconds. ?Repeat 2-3 times. Complete this exercise 1-2 times per  day. ? ?RANGE OF MOTION - Neck Circles  ?Stand or sit on a firm surface. Assume a good posture: chest up, shoulders drawn back, abdominal muscles slightly tense, knees unlocked (if standing) and feet hip width apart. ?Gently roll your head down and around from the back of one shoulder to the back of the other. The motion should never be forced or painful. ?Repeat the motion 10-20 times, or until you feel the  neck muscles relax and loosen. ?Repeat 2-3 times. Complete the exercise 1-2 times per day. ?STRENGTHENING EXERCISES - Cervical Strain and Sprain ?These exercises may help you when beginning to rehabilitate your injury. They may resolve your symptoms with or without further involvement from your physician, physical therapist, or athletic trainer. While completing these exercises, remember:  ?Muscles can gain both the endurance and the strength needed for everyday activities through controlled exercises. ?Complete these exercises as instructed by your physician, physical therapist, or athletic trainer. Progress the resistance and repetitions only as guided. ?You may experience muscle soreness or fatigue, but the pain or discomfort you are trying to eliminate should never worsen during these exercises. If this pain does worsen, stop and make certain you are following the directions exactly. If the pain is still present after adjustments, discontinue the exercise until you can discuss the trouble with your clinician. ? ?STRENGTH - Cervical Flexors, Isometric ?Face a wall, standing about 6 inches away. Place a small pillow, a ball about 6-8 inches in diameter, or a folded towel between your forehead and the wall. ?Slightly tuck your chin and gently push your forehead into the soft object. Push only with mild to moderate intensity, building up tension gradually. Keep your jaw and forehead relaxed. ?Hold 10 to 20 seconds. Keep your breathing relaxed. ?Release the tension slowly. Relax your neck muscles completely before you start the next repetition. ?Repeat 2-3 times. Complete this exercise 1 time per day. ? ?STRENGTH- Cervical Lateral Flexors, Isometric  ?Stand about 6 inches away from a wall. Place a small pillow, a ball about 6-8 inches in diameter, or a folded towel between the side of your head and the wall. ?Slightly tuck your chin and gently tilt your head into the soft object. Push only with mild to moderate  intensity, building up tension gradually. Keep your jaw and forehead relaxed. ?Hold 10 to 20 seconds. Keep your breathing relaxed. ?Release the tension slowly. Relax your neck muscles completely before you start the next repetition. ?Repeat 2-3 times. Complete this exercise 1 time per day. ? ?STRENGTH - Cervical Extensors, Isometric  ?Stand about 6 inches away from a wall. Place a small pillow, a ball about 6-8 inches in diameter, or a folded towel between the back of your head and the wall. ?Slightly tuck your chin and gently tilt your head back into the soft object. Push only with mild to moderate intensity, building up tension gradually. Keep your jaw and forehead relaxed. ?Hold 10 to 20 seconds. Keep your breathing relaxed. ?Release the tension slowly. Relax your neck muscles completely before you start the next repetition. ?Repeat 2-3 times. Complete this exercise 1 time per day. ? ?POSTURE AND BODY MECHANICS CONSIDERATIONS ?Keeping correct posture when sitting, standing or completing your activities will reduce the stress put on different body tissues, allowing injured tissues a chance to heal and limiting painful experiences. The following are general guidelines for improved posture. Your physician or physical therapist will provide you with any instructions specific to your needs. While reading these guidelines, remember: ?The  exercises prescribed by your provider will help you have the flexibility and strength to maintain correct postures. ?The correct posture provides the optimal environment for your joints to work. All of your joints have less wear and tear when properly supported by a spine with good posture. This means you will experience a healthier, less painful body. ?Correct posture must be practiced with all of your activities, especially prolonged sitting and standing. Correct posture is as important when doing repetitive low-stress activities (typing) as it is when doing a single heavy-load  activity (lifting). ? ?PROLONGED STANDING WHILE SLIGHTLY LEANING FORWARD ?When completing a task that requires you to lean forward while standing in one place for a long time, place either foot up on a stationary 2- to [employer]

## 2021-11-03 NOTE — Progress Notes (Signed)
Chief Complaint  ?Patient presents with  ? Fatigue  ?  labs  ? ? ?Subjective: ?Patient is a 76 y.o. female here for fatigue. ? ?Over past week, has had low energy levels. Has been hydrating normally. Has not been eating as healthy lately. No unexplained wt changes. Has not been sick recently. She has a hx of low Vit D and low iron. No blood in stool or urine. Does no formal exercise.  ? ?She has a hx of Barrett's Esoph following w Clarene Essex. Prefers to change to [facility] if possible. Sleeps w pillow under upper back to elevate, causing neck/back issues.  ? ?Past Medical History:  ?Diagnosis Date  ? Barrett's esophagus   ? GERD (gastroesophageal reflux disease)   ? Hypertension   ? ? ?Objective: ?BP 120/72   Pulse 69   Temp 98.2 ?F (36.8 ?C) (Oral)   Ht 5' (1.524 m)   Wt 131 lb (59.4 kg)   SpO2 99%   BMI 25.58 kg/m?  ?General: Awake, appears stated age ?Heart: RRR, no LE edema ?Lungs: CTAB, no rales, wheezes or rhonchi. No accessory muscle use ?MSK: +kyphosis, no ttp in cerv spine or thor spine region ?Psych: Age appropriate judgment and insight, normal affect and mood ? ?Assessment and Plan: ?Fatigue, unspecified type - Plan: CBC, Comprehensive metabolic panel, TSH ? ?Vitamin D insufficiency - Plan: VITAMIN D 25 Hydroxy (Vit-D Deficiency, Fractures) ? ?History of iron deficiency - Plan: IBC + Ferritin ? ?Barrett's esophagus with dysplasia - Plan: Ambulatory referral to Gastroenterology ? ?Postural kyphosis of cervicothoracic region ? ?New issue w uncertain prog. Will ck labs, counseled on diet/exercise. Encouraged to cont to get good sleep. Does not seem anxious/depressed. Unlikely to be OSA.  ?Cont baseline supp, will make sure we do not need to add to this. ?No signs of bleeding, will ck.  ?Refer GI in area.  ?Stretches/exercises provided. Focus on maintaining good posture. Standing offer for PT.  ?F/u prn or as originally scheduled.  ?The patient voiced understanding and agreement to the plan. ? ?I spent 30  min w pt discussing the above plan in addition to reviewing her chart on the same day of the visit.  ? ?Shelda Pal, DO ?11/03/21  ?4:46 PM ? ? ? ? ?

## 2021-11-04 LAB — COMPREHENSIVE METABOLIC PANEL
ALT: 18 U/L (ref 0–35)
AST: 23 U/L (ref 0–37)
Albumin: 4.4 g/dL (ref 3.5–5.2)
Alkaline Phosphatase: 83 U/L (ref 39–117)
BUN: 16 mg/dL (ref 6–23)
CO2: 26 mEq/L (ref 19–32)
Calcium: 9.6 mg/dL (ref 8.4–10.5)
Chloride: 96 mEq/L (ref 96–112)
Creatinine, Ser: 0.76 mg/dL (ref 0.40–1.20)
GFR: 75.24 mL/min (ref >60.00)
Glucose, Bld: 84 mg/dL (ref 70–99)
Potassium: 4.5 mEq/L (ref 3.5–5.1)
Sodium: 133 mEq/L — ABNORMAL LOW (ref 135–145)
Total Bilirubin: 0.5 mg/dL (ref 0.2–1.2)
Total Protein: 7.2 g/dL (ref 6.0–8.3)

## 2021-11-04 LAB — IBC + FERRITIN
Ferritin: 20.7 ng/mL (ref 10.0–291.0)
Iron: 55 ug/dL (ref 42–145)
Saturation Ratios: 13.7 % — ABNORMAL LOW (ref 20.0–50.0)
TIBC: 401.8 ug/dL (ref 250.0–450.0)
Transferrin: 287 mg/dL (ref 212.0–360.0)

## 2021-11-04 LAB — TSH: TSH: 2.94 u[IU]/mL (ref 0.35–5.50)

## 2021-11-04 LAB — CBC
HCT: 36.4 % (ref 36.0–46.0)
Hemoglobin: 12.1 g/dL (ref 12.0–15.0)
MCHC: 33.3 g/dL (ref 30.0–36.0)
MCV: 91.5 fl (ref 78.0–100.0)
Platelets: 226 10*3/uL (ref 150.0–400.0)
RBC: 3.97 Mil/uL (ref 3.87–5.11)
RDW: 13 % (ref 11.5–15.5)
WBC: 6.5 10*3/uL (ref 4.0–10.5)

## 2021-11-04 LAB — VITAMIN D 25 HYDROXY (VIT D DEFICIENCY, FRACTURES): VITD: 35.01 ng/mL (ref 30.00–100.00)

## 2021-11-13 ENCOUNTER — Encounter: Payer: Self-pay | Admitting: Family Medicine

## 2021-12-17 ENCOUNTER — Other Ambulatory Visit: Payer: Self-pay | Admitting: Family Medicine

## 2021-12-19 ENCOUNTER — Telehealth: Payer: Self-pay | Admitting: Gastroenterology

## 2021-12-19 NOTE — Telephone Encounter (Signed)
Records reviewed and notable for the following:  Previously followed with Dr. Lu Duffel, at [place], last seen on 04/25/2021.  History of diarrhea with fecal urgency and can have episodes of cold sweats and near syncope and sometimes nausea.  No preceding changes in diet or medications other than Rx for restless leg syndrome.  Plan at that last appointment was for sleep studies, trial of probiotics, and discussion with PCM about decreasing her RLS medication.  Additionally, reported history of GERD with nondysplastic Barrett's/esophagus.  Reflux treated with ease omeprazole 40 mg twice daily.  History of laparoscopic hiatal hernia repair in 01/2017.  Last colonoscopy was 2020 and notable for tubular adenomas with recommended repeat in 2025 endoscopic History: - EGD 09/2004, 10/2007, 10/2010, 12/2013: No results available for review - EGD 09/2018: Barrett's Esophagus.  Recommended repeat in 3 years - Colonoscopy 09/2004, 02/2015, 05/2015: No report available for review - Colonoscopy 09/2018: Tubular adenoma.  Recommended repeat in 5 years   Patient follows with Dr. Nani Ravens. Ok to transfer her GI care to me.  Schedule OV to establish care.

## 2021-12-19 NOTE — Telephone Encounter (Signed)
error 

## 2021-12-19 NOTE — Telephone Encounter (Signed)
Hi Dr. Bryan Lemma,  This patient has requested a transfer of care to you.  Her records are being sent to you for your review.  She is trying to get all of her doctors under the [facility] umbrella so that all her records will be in one place.  Please let me know if you approve the transfer.  Thank you.

## 2022-01-01 ENCOUNTER — Other Ambulatory Visit: Payer: Self-pay | Admitting: Family Medicine

## 2022-01-09 ENCOUNTER — Ambulatory Visit (INDEPENDENT_AMBULATORY_CARE_PROVIDER_SITE_OTHER): Payer: Medicare Other | Admitting: Family Medicine

## 2022-01-09 ENCOUNTER — Encounter: Payer: Self-pay | Admitting: Family Medicine

## 2022-01-09 VITALS — BP 120/80 | HR 75 | Temp 98.4°F | Ht 60.0 in | Wt 132.0 lb

## 2022-01-09 DIAGNOSIS — E785 Hyperlipidemia, unspecified: Secondary | ICD-10-CM | POA: Diagnosis not present

## 2022-01-09 DIAGNOSIS — M810 Age-related osteoporosis without current pathological fracture: Secondary | ICD-10-CM

## 2022-01-09 DIAGNOSIS — I10 Essential (primary) hypertension: Secondary | ICD-10-CM | POA: Diagnosis not present

## 2022-01-09 DIAGNOSIS — I499 Cardiac arrhythmia, unspecified: Secondary | ICD-10-CM

## 2022-01-09 LAB — LIPID PANEL
Cholesterol: 200 mg/dL (ref 0–200)
HDL: 77.5 mg/dL (ref >39.00)
LDL Cholesterol: 107 mg/dL — ABNORMAL HIGH (ref 0–99)
NonHDL: 122.66
Total CHOL/HDL Ratio: 3
Triglycerides: 76 mg/dL (ref 0.0–149.0)
VLDL: 15.2 mg/dL (ref 0.0–40.0)

## 2022-01-09 NOTE — Progress Notes (Signed)
Chief Complaint  Patient presents with   Follow-up    6 month    Subjective Sheila Rice is a 76 y.o. female who presents for hypertension follow up. She does not monitor home blood pressures. She is compliant with medications- Norvasc 2.5 mg/d, losartan 100 mg/d. Patient has these side effects of medication: none She is adhering to a healthy diet overall. Current exercise: cycling No Cp or SOB.  Hyperlipidemia Patient presents for dyslipidemia follow up. Currently being treated with Lipitor 40 mg/d and compliance with treatment thus far has been good. She denies myalgias. Diet/exercise as above.  The patient is not known to have coexisting coronary artery disease.  Osteoporosis Currently taking Fosamax 70 mg daily, compliant, no adverse effects. She is compliant with calcium and vitamin D supplementation. As above she cycles but does not do much for upper extremity weight resistance. No recent falls or fractures.   Past Medical History:  Diagnosis Date   Barrett's esophagus    GERD (gastroesophageal reflux disease)    Hypertension     Exam BP 120/80   Pulse 75   Temp 98.4 F (36.9 C) (Oral)   Ht 5' (1.524 m)   Wt 132 lb (59.9 kg)   SpO2 99%   BMI 25.78 kg/m  General:  well developed, well nourished, in no apparent distress Heart: Regular rate, early beat noted intermittently, no bruits, no LE edema Lungs: clear to auscultation, no accessory muscle use Psych: well oriented with normal range of affect and appropriate judgment/insight  Essential hypertension  Hyperlipidemia, unspecified hyperlipidemia type - Plan: Lipid panel  Age-related osteoporosis without current pathological fracture  Irregular heartbeat - Plan: EKG 12-Lead  Chronic, stable.  Continue losartan 100 mg daily, will amlodipine 2.5 mg daily.  Counseled on diet and exercise. Chronic, stable.  Check lipids today.  Continue Lipitor 40 mg daily.  Last lab draw, she had not taken it for few weeks  and cannot remember why.  She is currently compliant. Chronic, stable.  Continue calcium and vitamin D supplementation in addition to Fosamax 70 mg weekly.  Upper body weight resistance exercise recommended. EKG shows normal sinus rhythm with PACs.  No signs of ischemia.  Reassurance. F/u in 6 months for physical or as needed. The patient voiced understanding and agreement to the plan.  [place], DO 01/09/22  10:07 AM

## 2022-01-09 NOTE — Patient Instructions (Addendum)
Give [place] 2-3 business days to get the results of your labs back.   Keep the diet clean and stay active. Add some weight resistance exercise to your regimen.   Continue taking calcium and Vit D.   Let us know if you need anything.

## 2022-01-20 ENCOUNTER — Ambulatory Visit (INDEPENDENT_AMBULATORY_CARE_PROVIDER_SITE_OTHER): Payer: Medicare Other | Admitting: Gastroenterology

## 2022-01-20 ENCOUNTER — Encounter: Payer: Self-pay | Admitting: Gastroenterology

## 2022-01-20 VITALS — BP 172/78 | HR 68 | Ht 60.0 in | Wt 131.1 lb

## 2022-01-20 DIAGNOSIS — K219 Gastro-esophageal reflux disease without esophagitis: Secondary | ICD-10-CM | POA: Diagnosis not present

## 2022-01-20 DIAGNOSIS — R110 Nausea: Secondary | ICD-10-CM

## 2022-01-20 DIAGNOSIS — K449 Diaphragmatic hernia without obstruction or gangrene: Secondary | ICD-10-CM

## 2022-01-20 DIAGNOSIS — K2270 Barrett's esophagus without dysplasia: Secondary | ICD-10-CM | POA: Diagnosis not present

## 2022-01-20 DIAGNOSIS — R194 Change in bowel habit: Secondary | ICD-10-CM

## 2022-01-20 MED ORDER — ESOMEPRAZOLE MAGNESIUM 40 MG PO CPDR: 40.0000 mg | DELAYED_RELEASE_CAPSULE | Freq: Two times a day (BID) | ORAL | 5 refills | Status: DC

## 2022-01-20 NOTE — Progress Notes (Signed)
Chief Complaint:    GERD, change in bowel habits, nausea   HPI:     Patient is a 76 y.o. female presenting to the Gastroenterology Clinic for initial appointment to establish care.  Previously followed with [name] at [facility], last seen on 04/25/2021 for continued evaluation of diarrhea, fecal urgency, along with history of GERD with nondysplastic Barrett's Esophagus.  Reflux was being treated with omeprazole 40 mg twice daily. History of laparoscopic hiatal hernia repair in 01/2017.   Endoscopic History: - EGD 09/2004, 10/2007, 10/2010, 12/2013: No results available for review - EGD 09/2018: Barrett's Esophagus.  Recommended repeat in 3 years - Colonoscopy 09/2004, 02/2015, 05/2015: No report available for review - Colonoscopy 09/2018: Tubular adenoma.  Recommended repeat in 5 years - EGD (03/2020, Dr. Alessandra Bevels; hospital admission for coffee-ground emesis): 2 superficial esophageal ulcers in the distal esophagus.  Large hiatal hernia with evidence of inflammation and contact bleeding.  Scattered mild antral gastritis, a few small gastric polyps.  Treated with high-dose PPI with recommendation to repeat upper endoscopy in 2 months.  GI imaging: - 09/04/2016: UGI: Large hiatal hernia containing vast majority of stomach with some organoaxial rotation.  Feline the distal esophagus can be associated with esophagitis. - 01/15/2017: Laparoscopic hiatal hernia repair and Nissen fundoplication - 10/22/6220: Barium esophagram: Large hiatal hernia has markedly improved after fundoplication.  Contrast fills the esophagus and easily traverses across the GE junction.  Minimal dilation above the GE junction which may represent small residual hiatal hernia. - 08/13/2017: Barium esophagram: Nissen fundoplication.  Esophagus is diffusely dilated with poor peristalsis.  GE junction remains above the diaphragm.  Mild to moderate residual hiatal hernia.  Focal narrowing of the stomach at the level of the diaphragm.  Barium  tablet extends into hiatal hernia but did not pass through the diaphragm into the stomach.  This is likely due to redundancy of the hiatal hernia. - 03/14/2020: RUQ [place]: Heterogenous liver suspicious for fatty infiltration or hepatocellular disease.  Normal GB, no duct dilation  Today, she states she has had GERD for many years, currently taking Nexium 40 mg BID, and will try to limit to 1/day at times. Sleeps with HOB elevated. No dysphagia. S/p Nissen in 2018, with subsequent recurrence of hernia and GERD as above.  Separately, with episodic nausea, lightheadedness, and diarrhea since 03/2021. Typically improves with prn Dramamine.  Started probiotic in 04/2021 with some improvement. No associated CP, palpitations, SOB. No hematochezia, melena.  Not associated with reflux episodes.  No known family history of CRC, GI malignancy, liver disease, pancreatic disease, or IBD.        Latest Ref Rng & Units 11/03/2021    3:07 PM 07/28/2021   10:02 AM 07/11/2021    9:58 AM  CMP  Glucose 70 - 99 mg/dL 84  94  100   BUN 6 - 23 mg/dL '16  15  17   '$ Creatinine 0.40 - 1.20 mg/dL 0.76  0.75  0.76   Sodium 135 - 145 mEq/L 133  139  130   Potassium 3.5 - 5.1 mEq/L 4.5  4.3  4.3   Chloride 96 - 112 mEq/L 96  102  93   CO2 19 - 32 mEq/L '26  28  29   '$ Calcium 8.4 - 10.5 mg/dL 9.6  9.2  9.5   Total Protein 6.0 - 8.3 g/dL 7.2   6.7   Total Bilirubin 0.2 - 1.2 mg/dL 0.5   0.7   Alkaline Phos 39 -  117 U/L 83   63   AST 0 - 37 U/L 23   24   ALT 0 - 35 U/L 18   21       Latest Ref Rng & Units 11/03/2021    3:07 PM 03/15/2020    5:30 AM 03/14/2020    1:01 PM  CBC  WBC 4.0 - 10.5 K/uL 6.5  5.9  6.0   Hemoglobin 12.0 - 15.0 g/dL 12.1  10.7  11.1   Hematocrit 36.0 - 46.0 % 36.4  32.6  33.5   Platelets 150.0 - 400.0 K/uL 226.0  171  194      Review of systems:     No chest pain, no SOB, no fevers, no urinary sx   Past Medical History:  Diagnosis Date   Barrett's esophagus    GERD (gastroesophageal reflux  disease)    History of adenomatous polyp of colon    Hypertension     Patient's surgical history, family medical history, social history, medications and allergies were all reviewed in Epic    Current Outpatient Medications  Medication Sig Dispense Refill   alendronate (FOSAMAX) 70 MG tablet Take 1 tablet (70 mg total) by mouth every 7 (seven) days. Take with a full glass of water on an empty stomach. 4 tablet 3   amLODipine (NORVASC) 2.5 MG tablet Take 1 tablet by mouth once daily 90 tablet 0   atorvastatin (LIPITOR) 40 MG tablet Take 1 tablet (40 mg total) by mouth daily. 90 tablet 3   diclofenac Sodium (VOLTAREN) 1 % GEL Apply 2 g topically 4 (four) times daily. 100 g 2   esomeprazole (NEXIUM) 40 MG capsule Take 1 capsule (40 mg total) by mouth 2 (two) times daily before a meal. 60 capsule 2   losartan (COZAAR) 100 MG tablet Take 1 tablet by mouth once daily 90 tablet 0   polyvinyl alcohol (LIQUIFILM TEARS) 1.4 % ophthalmic solution Place 1 drop into both eyes as needed for dry eyes.     Probiotic Product (PROBIOTIC DAILY PO) Take 1 tablet by mouth daily.     rOPINIRole (REQUIP) 2 MG tablet Take 1 tablet (2 mg total) by mouth at bedtime. 90 tablet 2   VITAMIN D PO Take 1 capsule by mouth daily.     zolpidem (AMBIEN) 5 MG tablet Take 0.5-1 tablets (2.5-5 mg total) by mouth at bedtime as needed for sleep. 10 tablet 0   No current facility-administered medications for this visit.    Physical Exam:     BP (!) 172/78   Pulse 68   Ht 5' (1.524 m)   Wt 131 lb 2 oz (59.5 kg)   BMI 25.61 kg/m   GENERAL:  Pleasant female in NAD PSYCH: : Cooperative, normal affect EENT:  conjunctiva pink, mucous membranes moist, neck supple without masses CARDIAC:  RRR, no murmur heard, no peripheral edema PULM: Normal respiratory effort, lungs CTA bilaterally, no wheezing ABDOMEN:  Nondistended, soft, nontender. No obvious masses, no hepatomegaly,  normal bowel sounds SKIN:  turgor, no lesions  seen Musculoskeletal:  Normal muscle tone, normal strength NEURO: Alert and oriented x 3, no focal neurologic deficits   IMPRESSION and PLAN:    1) GERD 2) Hiatal hernia 3) History of Nissen fundoplication 4) Barrett's Esophagus 76 year old female with longstanding history of GERD c/b hiatal hernia, erosive esophagitis, and Barrett's Esophagus (unknown length, histology), s/p laparoscopic hiatal hernia repair and Nissen fundoplication in 2409, with recurrence of hernia on imaging and prior upper  endoscopy.  Reflux symptoms generally controllable with Nexium daily, and takes second dose 2-3 times per week for breakthrough symptoms.  - Continue Nexium.  Placed refill today - Continue antireflux lifestyle/dietary modifications - EGD to evaluate hernia size, resolution of previously noted erosive esophagitis, along with Barrett's Esophagus surveillance - Assess wrap integrity at time of upper endoscopy  5) Change in bowel habits 6) Nausea - Episodic loose stools with tends to accompany episodic lightheadedness and nausea - Evaluate for luminal/mucosal etiology of nausea and loose stools at time of upper endoscopy - Start fiber supplement  7) History of colon polyps - Due for repeat colonoscopy in 2025 for ongoing polyp surveillance  The indications, risks, and benefits of EGD were explained to the patient in detail. Risks include but are not limited to bleeding, perforation, adverse reaction to medications, and cardiopulmonary compromise. Sequelae include but are not limited to the possibility of surgery, hospitalization, and mortality. The patient verbalized understanding and wished to proceed. All questions answered, referred to scheduler. Further recommendations pending results of the exam.    Sheila Rice ,DO, FACG 01/20/2022, 9:35 AM

## 2022-01-20 NOTE — Patient Instructions (Addendum)
If you are age 78 or older, your body mass index should be between 23-30. Your There is no height or weight on file to calculate BMI. If this is out of the aforementioned range listed, please consider follow up with your Primary Care Provider.   ________________________________________________________  The [facility] providers would like to encourage you to use [facility] to communicate with providers for non-urgent requests or questions.  Due to long hold times on the telephone, sending your provider a message by [facility] may be a faster and more efficient way to get a response.  Please allow 48 business hours for a response.  Please remember that this is for non-urgent requests.  _______________________________________________________  Due to recent changes in healthcare laws, you may see the results of your imaging and laboratory studies on MyChart before your provider has had a chance to review them.  We understand that in some cases there may be results that are confusing or concerning to you. Not all laboratory results come back in the same time frame and the provider may be waiting for multiple results in order to interpret others.  Please give [place] 48 hours in order for your provider to thoroughly review all the results before contacting the office for clarification of your results.   We have sent the following medications to your pharmacy for you to pick up at your convenience: Nexium  You have been scheduled for an endoscopy. Please follow written instructions given to you at your visit today. If you use inhalers (even only as needed), please bring them with you on the day of your procedure.     Thank you for choosing me and [facility].  Vito Cirigliano, D.O.

## 2022-01-26 ENCOUNTER — Encounter: Payer: Self-pay | Admitting: Gastroenterology

## 2022-01-27 MED ORDER — ESOMEPRAZOLE MAGNESIUM 40 MG PO CPDR: DELAYED_RELEASE_CAPSULE | ORAL | 5 refills | Status: DC

## 2022-01-27 NOTE — Addendum Note (Signed)
Addended by: Berniece Salines A on: 01/27/2022 09:24 AM   Modules accepted: Orders

## 2022-02-04 ENCOUNTER — Ambulatory Visit (AMBULATORY_SURGERY_CENTER): Payer: Medicare Other | Admitting: Gastroenterology

## 2022-02-04 ENCOUNTER — Encounter: Payer: Self-pay | Admitting: Gastroenterology

## 2022-02-04 VITALS — BP 112/52 | HR 58 | Temp 96.6°F | Resp 13 | Ht 60.0 in | Wt 131.0 lb

## 2022-02-04 DIAGNOSIS — K219 Gastro-esophageal reflux disease without esophagitis: Secondary | ICD-10-CM

## 2022-02-04 DIAGNOSIS — K449 Diaphragmatic hernia without obstruction or gangrene: Secondary | ICD-10-CM

## 2022-02-04 DIAGNOSIS — R110 Nausea: Secondary | ICD-10-CM | POA: Diagnosis not present

## 2022-02-04 DIAGNOSIS — I10 Essential (primary) hypertension: Secondary | ICD-10-CM | POA: Diagnosis not present

## 2022-02-04 DIAGNOSIS — K259 Gastric ulcer, unspecified as acute or chronic, without hemorrhage or perforation: Secondary | ICD-10-CM

## 2022-02-04 DIAGNOSIS — K2901 Acute gastritis with bleeding: Secondary | ICD-10-CM | POA: Diagnosis not present

## 2022-02-04 DIAGNOSIS — K2270 Barrett's esophagus without dysplasia: Secondary | ICD-10-CM | POA: Diagnosis not present

## 2022-02-04 DIAGNOSIS — R194 Change in bowel habit: Secondary | ICD-10-CM

## 2022-02-04 DIAGNOSIS — K2970 Gastritis, unspecified, without bleeding: Secondary | ICD-10-CM | POA: Diagnosis not present

## 2022-02-04 MED ORDER — SODIUM CHLORIDE 0.9 % IV SOLN: 500.0000 mL | Freq: Once | INTRAVENOUS | Status: DC

## 2022-02-04 NOTE — Progress Notes (Signed)
GASTROENTEROLOGY PROCEDURE H&P NOTE   Primary Care Physician: Shelda Pal, DO    Reason for Procedure:   GERD, Barrett's Esophagus surveillance, hiatal hernia, hx of fundoplication 1610, ME pain, nausea, change in bowel habits  Plan:    EGD  Patient is appropriate for endoscopic procedure(s) in the ambulatory ([facility]) setting.  The nature of the procedure, as well as the risks, benefits, and alternatives were carefully and thoroughly reviewed with the patient. Ample time for discussion and questions allowed. The patient understood, was satisfied, and agreed to proceed.     HPI: Sheila Rice is a 76 y.o. female who presents for EGDfor evaluation of GERD, BE surveillance, HH, nausea, and change in bowel habits .  Patient was most recently seen in the Gastroenterology Clinic on 01/20/2022 by me.  No interval change in medical history since that appointment. Please refer to that note for full details regarding GI history and clinical presentation.   Past Medical History:  Diagnosis Date   Barrett's esophagus    GERD (gastroesophageal reflux disease)    History of adenomatous polyp of colon    Hypertension     Past Surgical History:  Procedure Laterality Date   BACK SURGERY     Spinal Stenosis   BIOPSY  03/15/2020   Procedure: BIOPSY;  Surgeon: Otis Brace, MD;  Location: [facility];  Service: Gastroenterology;;   BUNIONECTOMY     COLONOSCOPY     ESOPHAGOGASTRODUODENOSCOPY (EGD) WITH PROPOFOL N/A 03/15/2020   Procedure: ESOPHAGOGASTRODUODENOSCOPY (EGD) WITH PROPOFOL;  Surgeon: Otis Brace, MD;  Location: [facility];  Service: Gastroenterology;  Laterality: N/A;   LAPAROSCOPIC NISSEN FUNDOPLICATION N/A 96/10/5407   Procedure: LAPAROSCOPIC NISSEN FUNDOPLICATION and HIATAL HERNIA REPAIR;  Surgeon: Jackolyn Confer, MD;  Location: [facility];  Service: General;  Laterality: N/A;   UPPER GASTROINTESTINAL ENDOSCOPY      Prior to Admission medications    Medication Sig Start Date End Date Taking? Authorizing Provider  alendronate (FOSAMAX) 70 MG tablet Take 1 tablet (70 mg total) by mouth every 7 (seven) days. Take with a full glass of water on an empty stomach. 12/18/20  Yes Shelda Pal, DO  amLODipine (NORVASC) 2.5 MG tablet Take 1 tablet by mouth once daily 12/17/21  Yes Wendling, Crosby Oyster, DO  atorvastatin (LIPITOR) 40 MG tablet Take 1 tablet (40 mg total) by mouth daily. 01/07/21  Yes Shelda Pal, DO  esomeprazole (NEXIUM) 40 MG capsule Take 1 capsule (40 mg total) by mouth daily. Take an additional capsule as needed for breakthrough reflux. 01/27/22  Yes Eda Magnussen V, DO  losartan (COZAAR) 100 MG tablet Take 1 tablet by mouth once daily 01/01/22  Yes Wendling, Crosby Oyster, DO  polyvinyl alcohol (LIQUIFILM TEARS) 1.4 % ophthalmic solution Place 1 drop into both eyes as needed for dry eyes.   Yes [provider]  Probiotic Product (PROBIOTIC DAILY PO) Take 1 tablet by mouth daily.   Yes [provider]  rOPINIRole (REQUIP) 2 MG tablet Take 1 tablet (2 mg total) by mouth at bedtime. 07/11/21  Yes Shelda Pal, DO  VITAMIN D PO Take 1 capsule by mouth daily.   Yes [provider]  diclofenac Sodium (VOLTAREN) 1 % GEL Apply 2 g topically 4 (four) times daily. 03/15/20   Nita Sells, MD  zolpidem (AMBIEN) 5 MG tablet Take 0.5-1 tablets (2.5-5 mg total) by mouth at bedtime as needed for sleep. Patient not taking: Reported on 02/04/2022 01/07/21   Shelda Pal,  DO    Current Outpatient Medications  Medication Sig Dispense Refill   alendronate (FOSAMAX) 70 MG tablet Take 1 tablet (70 mg total) by mouth every 7 (seven) days. Take with a full glass of water on an empty stomach. 4 tablet 3   amLODipine (NORVASC) 2.5 MG tablet Take 1 tablet by mouth once daily 90 tablet 0   atorvastatin (LIPITOR) 40 MG tablet Take 1 tablet (40 mg total) by mouth daily. 90 tablet 3    esomeprazole (NEXIUM) 40 MG capsule Take 1 capsule (40 mg total) by mouth daily. Take an additional capsule as needed for breakthrough reflux. 60 capsule 5   losartan (COZAAR) 100 MG tablet Take 1 tablet by mouth once daily 90 tablet 0   polyvinyl alcohol (LIQUIFILM TEARS) 1.4 % ophthalmic solution Place 1 drop into both eyes as needed for dry eyes.     Probiotic Product (PROBIOTIC DAILY PO) Take 1 tablet by mouth daily.     rOPINIRole (REQUIP) 2 MG tablet Take 1 tablet (2 mg total) by mouth at bedtime. 90 tablet 2   VITAMIN D PO Take 1 capsule by mouth daily.     diclofenac Sodium (VOLTAREN) 1 % GEL Apply 2 g topically 4 (four) times daily. 100 g 2   zolpidem (AMBIEN) 5 MG tablet Take 0.5-1 tablets (2.5-5 mg total) by mouth at bedtime as needed for sleep. (Patient not taking: Reported on 02/04/2022) 10 tablet 0   Current Facility-Administered Medications  Medication Dose Route Frequency Provider Last Rate Last Admin   0.9 %  sodium chloride infusion  500 mL Intravenous Once Anitria Andon V, DO        Allergies as of 02/04/2022 - Review Complete 02/04/2022  Allergen Reaction Noted   Ace inhibitors Cough 01/05/2017    Family History  Problem Relation Age of Onset   Deep vein thrombosis Mother    Heart disease Father    Colon cancer Neg Hx    Colon polyps Neg Hx    Esophageal cancer Neg Hx    Pancreatic cancer Neg Hx    Stomach cancer Neg Hx    Diabetes Neg Hx    Rectal cancer Neg Hx     Social History   Socioeconomic History   Marital status: Married    Spouse name: Not on file   Number of children: Not on file   Years of education: Not on file   Highest education level: Not on file  Occupational History   Not on file  Tobacco Use   Smoking status: Never   Smokeless tobacco: Never  Vaping Use   Vaping Use: Never used  Substance and Sexual Activity   Alcohol use: Yes    Comment: Occa   Drug use: No   Sexual activity: Not on file  Other Topics Concern   Not on file   Social History Narrative   Not on file   Social Determinants of Health   Financial Resource Strain: Not on file  Food Insecurity: Not on file  Transportation Needs: Not on file  Physical Activity: Not on file  Stress: Not on file  Social Connections: Not on file  Intimate Partner Violence: Not on file    Physical Exam: Vital signs in last 24 hours: '@BP'$  (!) 158/59   Pulse 61   Temp (!) 96.6 F (35.9 C) (Skin)   Resp 14   Ht 5' (1.524 m)   Wt 131 lb (59.4 kg)   SpO2 98%   BMI 25.58  kg/m  GEN: NAD EYE: Sclerae anicteric ENT: MMM CV: Non-tachycardic Pulm: CTA b/l GI: Soft, NT/ND NEURO:  Alert & Oriented x 3   Gerrit Heck, DO [facility]   02/04/2022 9:50 AM

## 2022-02-04 NOTE — Progress Notes (Signed)
Report to PACU, RN, vss, BBS= Clear.  

## 2022-02-04 NOTE — Patient Instructions (Signed)
Please read handouts provided. Continue present medications. Await pathology results. Increase Nexium ( esomeprazole ) to 40 mg twice daily for 4 weeks, then reduce back to 40 mg daily. Return to GI clinic at appointment to be scheduled.   YOU HAD AN ENDOSCOPIC PROCEDURE TODAY AT [facility]:   Refer to the procedure report that was given to you for any specific questions about what was found during the examination.  If the procedure report does not answer your questions, please call your gastroenterologist to clarify.  If you requested that your care partner not be given the details of your procedure findings, then the procedure report has been included in a sealed envelope for you to review at your convenience later.  YOU SHOULD EXPECT: Some feelings of bloating in the abdomen. Passage of more gas than usual.  Walking can help get rid of the air that was put into your GI tract during the procedure and reduce the bloating. If you had a lower endoscopy (such as a colonoscopy or flexible sigmoidoscopy) you may notice spotting of blood in your stool or on the toilet paper. If you underwent a bowel prep for your procedure, you may not have a normal bowel movement for a few days.  Please Note:  You might notice some irritation and congestion in your nose or some drainage.  This is from the oxygen used during your procedure.  There is no need for concern and it should clear up in a day or so.  SYMPTOMS TO REPORT IMMEDIATELY:  Following upper endoscopy (EGD)  Vomiting of blood or coffee ground material  New chest pain or pain under the shoulder blades  Painful or persistently difficult swallowing  New shortness of breath  Fever of 100F or higher  Black, tarry-looking stools  For urgent or emergent issues, a gastroenterologist can be reached at any hour by calling [phone]. Do not use MyChart messaging for urgent concerns.    DIET:  We do recommend a small meal at first,  but then you may proceed to your regular diet.  Drink plenty of fluids but you should avoid alcoholic beverages for 24 hours.  ACTIVITY:  You should plan to take it easy for the rest of today and you should NOT DRIVE or use heavy machinery until tomorrow (because of the sedation medicines used during the test).    FOLLOW UP: Our staff will call the number listed on your records the next business day following your procedure.  We will call around 7:15- 8:00 am to check on you and address any questions or concerns that you may have regarding the information given to you following your procedure. If we do not reach you, we will leave a message.  If you develop any symptoms (ie: fever, flu-like symptoms, shortness of breath, cough etc.) before then, please call [phone].  If you test positive for Covid 19 in the 2 weeks post procedure, please call and report this information to [place].    If any biopsies were taken you will be contacted by phone or by letter within the next 1-3 weeks.  Please call us at [phone] if you have not heard about the biopsies in 3 weeks.    SIGNATURES/CONFIDENTIALITY: You and/or your care partner have signed paperwork which will be entered into your electronic medical record.  These signatures attest to the fact that that the information above on your After Visit Summary has been reviewed and is understood.  Full responsibility of  the confidentiality of this discharge information lies with you and/or your care-partner.

## 2022-02-04 NOTE — Op Note (Signed)
[place] Patient Name: Sheila Rice Procedure Date: 02/04/2022 9:41 AM MRN: [id number] Endoscopist: Gerrit Heck , MD Age: 76 Referring MD:  Date of Birth: 01-Feb-1946 Gender: Female Account #: [id number] Procedure:                Upper GI endoscopy Indications:              Esophageal reflux, Follow-up of Barrett's                            esophagus, Diarrhea/Change in stools, Nausea Medicines:                Monitored Anesthesia Care Procedure:                Pre-Anesthesia Assessment:                           - Prior to the procedure, a History and Physical                            was performed, and patient medications and                            allergies were reviewed. The patient's tolerance of                            previous anesthesia was also reviewed. The risks                            and benefits of the procedure and the sedation                            options and risks were discussed with the patient.                            All questions were answered, and informed consent                            was obtained. Prior Anticoagulants: The patient has                            taken no previous anticoagulant or antiplatelet                            agents. ASA Grade Assessment: II - A patient with                            mild systemic disease. After reviewing the risks                            and benefits, the patient was deemed in                            satisfactory condition to undergo the procedure.  After obtaining informed consent, the endoscope was                            passed under direct vision. Throughout the                            procedure, the patient's blood pressure, pulse, and                            oxygen saturations were monitored continuously. The                            GIF [id number] #[id number] was introduced through the                            mouth, and advanced to  the second part of duodenum.                            The upper GI endoscopy was accomplished without                            difficulty. The patient tolerated the procedure                            well. Scope In: Scope Out: Findings:                 There were esophageal mucosal changes classified as                            Barrett's stage C1-M2 per Prague criteria present                            in the lower third of the esophagus. The maximum                            longitudinal extent of these mucosal changes was 2                            cm in length. Mucosa was biopsied with a cold                            forceps for histology in 4 quadrants. One specimen                            bottle was sent to pathology. Estimated blood loss                            was minimal.                           A 4 cm hiatal hernia was present.  Evidence of a Nissen fundoplication was found in                            the cardia. The wrap appeared largely intact. This                            was traversed. This was located appeared to be                            located above the diaphragmatic hiatus, consistent                            with hernia recurrence.                           Localized inflammation characterized by erosions,                            erythema and shallow ulcerations was found in the                            gastric antrum. Biopsies were taken with a cold                            forceps for histology. Estimated blood loss was                            minimal.                           The gastric body and incisura were normal.                           The examined duodenum was normal. Biopsies were                            taken with a cold forceps for histology. Estimated                            blood loss was minimal. Complications:            No immediate complications. Estimated Blood Loss:      Estimated blood loss was minimal. Impression:               - Esophageal mucosal changes classified as                            Barrett's stage C1-M2 per Prague criteria. Biopsied.                           - 4 cm hiatal hernia.                           - A Nissen fundoplication was found. The wrap  appears intact.                           - Gastritis. Biopsied.                           - Normal gastric body and incisura.                           - Normal examined duodenum. Biopsied. Recommendation:           - Patient has a contact number available for                            emergencies. The signs and symptoms of potential                            delayed complications were discussed with the                            patient. Return to normal activities tomorrow.                            Written discharge instructions were provided to the                            patient.                           - Resume previous diet.                           - Continue present medications.                           - Increase Nexium (esomeprazole) to 40 mg PO BID                            for 4 weeks to promote mucosal healing of                            gastritis, then reduce back to 40 mg daily for                            reflux control.                           - Await pathology results.                           - Return to GI clinic at appointment to be                            scheduled. If continued reflux symtpoms, will                            discuss referral to Surgery for  consideration of                            hernia repair and redo fundoplication. Gerrit Heck, MD 02/04/2022 10:21:27 AM

## 2022-02-04 NOTE — Progress Notes (Signed)
Called to room to assist during endoscopic procedure.  Patient ID and intended procedure confirmed with present staff. Received instructions for my participation in the procedure from the performing physician.  

## 2022-02-05 ENCOUNTER — Telehealth: Payer: Self-pay

## 2022-02-05 NOTE — Telephone Encounter (Signed)
Left message on answering machine. 

## 2022-02-17 NOTE — Progress Notes (Unsigned)
Subjective:   Sheila Rice is a 76 y.o. female who presents for an Initial Medicare Annual Wellness Visit.  Review of Systems           Objective:    There were no vitals filed for this visit. There is no height or weight on file to calculate BMI.     03/14/2020   11:51 PM 03/14/2020   11:40 AM 03/14/2020   11:38 AM 03/13/2020    9:41 PM 09/30/2017    4:04 PM 01/15/2017    9:36 AM 01/15/2017    8:30 AM  Advanced Directives  Does Patient Have a Medical Advance Directive?  Yes Yes No No Yes   Type of Advance Directive Living will     [place];Living will   Does patient want to make changes to medical advance directive? Yes (Inpatient - patient defers changing a medical advance directive at this time - Information given) Yes (Inpatient - patient defers changing a medical advance directive at this time - Information given) Yes (Inpatient - patient defers changing a medical advance directive at this time - Information given)    No - Patient declined  Copy of [place] in Chart?      No - copy requested   Would patient like information on creating a medical advance directive?     No - Patient declined      Current Medications (verified) Outpatient Encounter Medications as of 02/18/2022  Medication Sig   alendronate (FOSAMAX) 70 MG tablet Take 1 tablet (70 mg total) by mouth every 7 (seven) days. Take with a full glass of water on an empty stomach.   amLODipine (NORVASC) 2.5 MG tablet Take 1 tablet by mouth once daily   atorvastatin (LIPITOR) 40 MG tablet Take 1 tablet (40 mg total) by mouth daily.   diclofenac Sodium (VOLTAREN) 1 % GEL Apply 2 g topically 4 (four) times daily.   esomeprazole (NEXIUM) 40 MG capsule Take 1 capsule (40 mg total) by mouth daily. Take an additional capsule as needed for breakthrough reflux.   losartan (COZAAR) 100 MG tablet Take 1 tablet by mouth once daily   polyvinyl alcohol (LIQUIFILM TEARS) 1.4 % ophthalmic solution  Place 1 drop into both eyes as needed for dry eyes.   Probiotic Product (PROBIOTIC DAILY PO) Take 1 tablet by mouth daily.   rOPINIRole (REQUIP) 2 MG tablet Take 1 tablet (2 mg total) by mouth at bedtime.   VITAMIN D PO Take 1 capsule by mouth daily.   zolpidem (AMBIEN) 5 MG tablet Take 0.5-1 tablets (2.5-5 mg total) by mouth at bedtime as needed for sleep. (Patient not taking: Reported on 02/04/2022)   No facility-administered encounter medications on file as of 02/18/2022.    Allergies (verified) Ace inhibitors   History: Past Medical History:  Diagnosis Date   Barrett's esophagus    GERD (gastroesophageal reflux disease)    History of adenomatous polyp of colon    Hypertension    Past Surgical History:  Procedure Laterality Date   BACK SURGERY     Spinal Stenosis   BIOPSY  03/15/2020   Procedure: BIOPSY;  Surgeon: Otis Brace, MD;  Location: [facility];  Service: Gastroenterology;;   BUNIONECTOMY     COLONOSCOPY     ESOPHAGOGASTRODUODENOSCOPY (EGD) WITH PROPOFOL N/A 03/15/2020   Procedure: ESOPHAGOGASTRODUODENOSCOPY (EGD) WITH PROPOFOL;  Surgeon: Otis Brace, MD;  Location: [facility];  Service: Gastroenterology;  Laterality: N/A;   LAPAROSCOPIC NISSEN FUNDOPLICATION N/A  01/15/2017   Procedure: LAPAROSCOPIC NISSEN FUNDOPLICATION and HIATAL HERNIA REPAIR;  Surgeon: Jackolyn Confer, MD;  Location: [facility];  Service: General;  Laterality: N/A;   UPPER GASTROINTESTINAL ENDOSCOPY     Family History  Problem Relation Age of Onset   Deep vein thrombosis Mother    Heart disease Father    Colon cancer Neg Hx    Colon polyps Neg Hx    Esophageal cancer Neg Hx    Pancreatic cancer Neg Hx    Stomach cancer Neg Hx    Diabetes Neg Hx    Rectal cancer Neg Hx    Social History   Socioeconomic History   Marital status: Married    Spouse name: Not on file   Number of children: Not on file   Years of education: Not on file   Highest education level: Not on file   Occupational History   Not on file  Tobacco Use   Smoking status: Never   Smokeless tobacco: Never  Vaping Use   Vaping Use: Never used  Substance and Sexual Activity   Alcohol use: Yes    Comment: Occa   Drug use: No   Sexual activity: Not on file  Other Topics Concern   Not on file  Social History Narrative   Not on file   Social Determinants of Health   Financial Resource Strain: Not on file  Food Insecurity: Not on file  Transportation Needs: Not on file  Physical Activity: Not on file  Stress: Not on file  Social Connections: Not on file    Tobacco Counseling Counseling given: Not Answered   Clinical Intake:                 Diabetic?no         Activities of Daily Living     No data to display          Patient Care Team: Shelda Pal, DO as PCP - General (Family Medicine)  Indicate any recent Medical Services you may have received from other than [facility] providers in the past year (date may be approximate).     Assessment:   This is a routine wellness examination for Sheila Rice.  Hearing/Vision screen No results found.  Dietary issues and exercise activities discussed:     Goals Addressed   None    Depression Screen    01/09/2022    9:06 AM 01/07/2021    9:56 AM  PHQ 2/9 Scores  PHQ - 2 Score 0 0  PHQ- 9 Score 0     Fall Risk    01/09/2022    9:06 AM 01/07/2021    9:56 AM  [place] in the past year? 0 0  Number falls in past yr: 0 0  Injury with Fall? 0 0  Risk for fall due to : No Fall Risks No Fall Risks  Follow up Falls evaluation completed Falls evaluation completed    [place]:  Any stairs in or around the home? {YES/NO:21197} If so, are there any without handrails? {YES/NO:21197} Home free of loose throw rugs in walkways, pet beds, electrical cords, etc? {YES/NO:21197} Adequate lighting in your home to reduce risk of falls? {YES/NO:21197}  ASSISTIVE DEVICES  UTILIZED TO PREVENT FALLS:  Life alert? {YES/NO:21197} Use of a cane, walker or w/c? {YES/NO:21197} Grab bars in the bathroom? {YES/NO:21197} Shower chair or bench in shower? {YES/NO:21197} Elevated toilet seat or a handicapped toilet? {YES/NO:21197}  TIMED  UP AND GO:  Was the test performed? {YES/NO:21197}.  Length of time to ambulate 10 feet: *** sec.   {Appearance of VXBL:3903009}  Cognitive Function:        Immunizations Immunization History  Administered Date(s) Administered   Influenza, High Dose Seasonal PF 06/24/2018, 04/05/2020, 05/06/2021   Influenza,inj,quad, With Preservative 04/05/2017   PFIZER(Purple Top)SARS-COV-2 Vaccination 08/11/2019, 09/05/2019, 04/12/2020, 12/19/2020   PNEUMOCOCCAL CONJUGATE-20 07/11/2021   Pfizer Covid-19 Vaccine Bivalent Booster 83yr & up 05/08/2021   Pneumococcal Polysaccharide-23 09/04/2014   Zoster Recombinat (Shingrix) 06/21/2017    {TDAP status:2101805}  Flu Vaccine status: Up to date  Pneumococcal vaccine status: Up to date  Covid-19 vaccine status: Completed vaccines  Qualifies for Shingles Vaccine? Yes   Zostavax completed No   {Shingrix Completed?:2101804}  Screening Tests Health Maintenance  Topic Date Due   INFLUENZA VACCINE  02/03/2022   Zoster Vaccines- Shingrix (2 of 2) 05/06/2022 (Originally 08/16/2017)   COVID-19 Vaccine (6 - Pfizer series) 07/12/2022 (Originally 09/05/2021)   Pneumonia Vaccine 76 Years old  Completed   DEXA SCAN  Completed   Hepatitis C Screening  Completed   HPV VACCINES  Aged Out   TETANUS/TDAP  Discontinued    Health Maintenance  Health Maintenance Due  Topic Date Due   INFLUENZA VACCINE  02/03/2022    Colorectal cancer screening: No longer required.   {Mammogram status:21018020}  {Bone Density status:21018021}  Lung Cancer Screening: (Low Dose CT Chest recommended if Age 251-80years, 30 pack-year currently smoking OR have quit w/in 15years.) does not qualify.   Lung  Cancer Screening Referral: n/a  Additional Screening:  Hepatitis C Screening: does qualify; Completed 05/05/12  Vision Screening: Recommended annual ophthalmology exams for early detection of glaucoma and other disorders of the eye. Is the patient up to date with their annual eye exam?  {YES/NO:21197} Who is the provider or what is the name of the office in which the patient attends annual eye exams? *** If pt is not established with a provider, would they like to be referred to a provider to establish care? {YES/NO:21197}.   Dental Screening: Recommended annual dental exams for proper oral hygiene  Community Resource Referral / Chronic Care Management: CRR required this visit?  {YES/NO:21197}  CCM required this visit?  {YES/NO:21197}     Plan:     I have personally reviewed and noted the following in the patient's chart:   Medical and social history Use of alcohol, tobacco or illicit drugs  Current medications and supplements including opioid prescriptions. {Opioid Prescriptions:[phone]} Functional ability and status Nutritional status Physical activity Advanced directives List of other physicians Hospitalizations, surgeries, and ER visits in previous 12 months Vitals Screenings to include cognitive, depression, and falls Referrals and appointments  In addition, I have reviewed and discussed with patient certain preventive protocols, quality metrics, and best practice recommendations. A written personalized care plan for preventive services as well as general preventive health recommendations were provided to patient.     SDuard BradyChism, CLake [place]  02/17/2022   Nurse Notes: ***

## 2022-02-18 ENCOUNTER — Ambulatory Visit (INDEPENDENT_AMBULATORY_CARE_PROVIDER_SITE_OTHER): Payer: Medicare Other

## 2022-02-18 DIAGNOSIS — Z0000 Encounter for general adult medical examination without abnormal findings: Secondary | ICD-10-CM | POA: Diagnosis not present

## 2022-02-18 NOTE — Patient Instructions (Signed)
Sheila Rice , Thank you for taking time to come for your Medicare Wellness Visit. I appreciate your ongoing commitment to your health goals. Please review the following plan we discussed and let me know if I can assist you in the future.   Screening recommendations/referrals: Colonoscopy: no longer needed Mammogram: declined Bone Density: declined Recommended yearly ophthalmology/optometry visit for glaucoma screening and checkup Recommended yearly dental visit for hygiene and checkup  Vaccinations: Influenza vaccine: up to date Pneumococcal vaccine: up to date Tdap vaccine: Due-May obtain vaccine at your local pharmacy.  Shingles vaccine: Due-May obtain vaccine at  your local pharmacy.    Covid-19:completed  Advanced directives: no  Conditions/risks identified: see problem list   Next appointment: Follow up in one year for your annual wellness visit    Preventive Care 65 Years and Older, Female Preventive care refers to lifestyle choices and visits with your health care provider that can promote health and wellness. What does preventive care include? A yearly physical exam. This is also called an annual well check. Dental exams once or twice a year. Routine eye exams. Ask your health care provider how often you should have your eyes checked. Personal lifestyle choices, including: Daily care of your teeth and gums. Regular physical activity. Eating a healthy diet. Avoiding tobacco and drug use. Limiting alcohol use. Practicing safe sex. Taking low-dose aspirin every day. Taking vitamin and mineral supplements as recommended by your health care provider. What happens during an annual well check? The services and screenings done by your health care provider during your annual well check will depend on your age, overall health, lifestyle risk factors, and family history of disease. Counseling  Your health care provider may ask you questions about your: Alcohol use. Tobacco  use. Drug use. Emotional well-being. Home and relationship well-being. Sexual activity. Eating habits. History of falls. Memory and ability to understand (cognition). Work and work Statistician. Reproductive health. Screening  You may have the following tests or measurements: Height, weight, and BMI. Blood pressure. Lipid and cholesterol levels. These may be checked every 5 years, or more frequently if you are over 82 years old. Skin check. Lung cancer screening. You may have this screening every year starting at age 94 if you have a 30-pack-year history of smoking and currently smoke or have quit within the past 15 years. Fecal occult blood test (FOBT) of the stool. You may have this test every year starting at age 74. Flexible sigmoidoscopy or colonoscopy. You may have a sigmoidoscopy every 5 years or a colonoscopy every 10 years starting at age 39. Hepatitis C blood test. Hepatitis B blood test. Sexually transmitted disease (STD) testing. Diabetes screening. This is done by checking your blood sugar (glucose) after you have not eaten for a while (fasting). You may have this done every 1-3 years. Bone density scan. This is done to screen for osteoporosis. You may have this done starting at age 45. Mammogram. This may be done every 1-2 years. Talk to your health care provider about how often you should have regular mammograms. Talk with your health care provider about your test results, treatment options, and if necessary, the need for more tests. Vaccines  Your health care provider may recommend certain vaccines, such as: Influenza vaccine. This is recommended every year. Tetanus, diphtheria, and acellular pertussis (Tdap, Td) vaccine. You may need a Td booster every 10 years. Zoster vaccine. You may need this after age 71. Pneumococcal 13-valent conjugate (PCV13) vaccine. One dose is recommended after age  65. Pneumococcal polysaccharide (PPSV23) vaccine. One dose is recommended  after age 69. Talk to your health care provider about which screenings and vaccines you need and how often you need them. This information is not intended to replace advice given to you by your health care provider. Make sure you discuss any questions you have with your health care provider. Document Released: 07/19/2015 Document Revised: 03/11/2016 Document Reviewed: 04/23/2015 Elsevier Interactive Patient Education  2017 [place] Prevention in the Home Falls can cause injuries. They can happen to people of all ages. There are many things you can do to make your home safe and to help prevent falls. What can I do on the outside of my home? Regularly fix the edges of walkways and driveways and fix any cracks. Remove anything that might make you trip as you walk through a door, such as a raised step or threshold. Trim any bushes or trees on the path to your home. Use bright outdoor lighting. Clear any walking paths of anything that might make someone trip, such as rocks or tools. Regularly check to see if handrails are loose or broken. Make sure that both sides of any steps have handrails. Any raised decks and porches should have guardrails on the edges. Have any leaves, snow, or ice cleared regularly. Use sand or salt on walking paths during winter. Clean up any spills in your garage right away. This includes oil or grease spills. What can I do in the bathroom? Use night lights. Install grab bars by the toilet and in the tub and shower. Do not use towel bars as grab bars. Use non-skid mats or decals in the tub or shower. If you need to sit down in the shower, use a plastic, non-slip stool. Keep the floor dry. Clean up any water that spills on the floor as soon as it happens. Remove soap buildup in the tub or shower regularly. Attach bath mats securely with double-sided non-slip rug tape. Do not have throw rugs and other things on the floor that can make you trip. What can I do  in the bedroom? Use night lights. Make sure that you have a light by your bed that is easy to reach. Do not use any sheets or blankets that are too big for your bed. They should not hang down onto the floor. Have a firm chair that has side arms. You can use this for support while you get dressed. Do not have throw rugs and other things on the floor that can make you trip. What can I do in the kitchen? Clean up any spills right away. Avoid walking on wet floors. Keep items that you use a lot in easy-to-reach places. If you need to reach something above you, use a strong step stool that has a grab bar. Keep electrical cords out of the way. Do not use floor polish or wax that makes floors slippery. If you must use wax, use non-skid floor wax. Do not have throw rugs and other things on the floor that can make you trip. What can I do with my stairs? Do not leave any items on the stairs. Make sure that there are handrails on both sides of the stairs and use them. Fix handrails that are broken or loose. Make sure that handrails are as long as the stairways. Check any carpeting to make sure that it is firmly attached to the stairs. Fix any carpet that is loose or worn. Avoid having throw rugs at  the top or bottom of the stairs. If you do have throw rugs, attach them to the floor with carpet tape. Make sure that you have a light switch at the top of the stairs and the bottom of the stairs. If you do not have them, ask someone to add them for you. What else can I do to help prevent falls? Wear shoes that: Do not have high heels. Have rubber bottoms. Are comfortable and fit you well. Are closed at the toe. Do not wear sandals. If you use a stepladder: Make sure that it is fully opened. Do not climb a closed stepladder. Make sure that both sides of the stepladder are locked into place. Ask someone to hold it for you, if possible. Clearly mark and make sure that you can see: Any grab bars or  handrails. First and last steps. Where the edge of each step is. Use tools that help you move around (mobility aids) if they are needed. These include: Canes. Walkers. Scooters. Crutches. Turn on the lights when you go into a dark area. Replace any light bulbs as soon as they burn out. Set up your furniture so you have a clear path. Avoid moving your furniture around. If any of your floors are uneven, fix them. If there are any pets around you, be aware of where they are. Review your medicines with your doctor. Some medicines can make you feel dizzy. This can increase your chance of falling. Ask your doctor what other things that you can do to help prevent falls. This information is not intended to replace advice given to you by your health care provider. Make sure you discuss any questions you have with your health care provider. Document Released: 04/18/2009 Document Revised: 11/28/2015 Document Reviewed: 07/27/2014 Elsevier Interactive Patient Education  2017 [employer].

## 2022-03-04 ENCOUNTER — Ambulatory Visit: Payer: Medicare Other | Admitting: Gastroenterology

## 2022-03-04 ENCOUNTER — Encounter: Payer: Self-pay | Admitting: Gastroenterology

## 2022-03-04 VITALS — BP 140/78 | HR 63 | Ht 60.5 in | Wt 130.0 lb

## 2022-03-04 DIAGNOSIS — Z86010 Personal history of colonic polyps: Secondary | ICD-10-CM

## 2022-03-04 DIAGNOSIS — Z9889 Other specified postprocedural states: Secondary | ICD-10-CM | POA: Diagnosis not present

## 2022-03-04 DIAGNOSIS — K2270 Barrett's esophagus without dysplasia: Secondary | ICD-10-CM

## 2022-03-04 DIAGNOSIS — K219 Gastro-esophageal reflux disease without esophagitis: Secondary | ICD-10-CM | POA: Diagnosis not present

## 2022-03-04 DIAGNOSIS — K31A0 Gastric intestinal metaplasia, unspecified: Secondary | ICD-10-CM

## 2022-03-04 DIAGNOSIS — K449 Diaphragmatic hernia without obstruction or gangrene: Secondary | ICD-10-CM | POA: Diagnosis not present

## 2022-03-04 NOTE — Progress Notes (Signed)
Chief Complaint:    GERD, postprocedure follow-up  GI History: 76 y.o. female follows in the GI clinic for longstanding history of GERD and nondysplastic Barrett's Esophagus..  Previously followed with [name] at [facility].  History of laparoscopic hiatal hernia repair in 01/2017.   Endoscopic History: - EGD 09/2004, 10/2007, 10/2010, 12/2013: No results available for review - EGD 09/2018: Barrett's Esophagus.  Recommended repeat in 3 years - Colonoscopy 09/2004, 02/2015, 05/2015: No report available for review - Colonoscopy 09/2018: Tubular adenoma.  Recommended repeat in 5 years - EGD (03/2020, Dr. Alessandra Bevels; hospital admission for coffee-ground emesis): 2 superficial esophageal ulcers in the distal esophagus.  Large hiatal hernia with evidence of inflammation and contact bleeding.  Scattered mild antral gastritis, a few small gastric polyps.  Treated with high-dose PPI with recommendation to repeat upper endoscopy in 2 months.   GI imaging: - 09/04/2016: UGI: Large hiatal hernia containing vast majority of stomach with some organoaxial rotation.  Feline the distal esophagus can be associated with esophagitis. - 01/15/2017: Laparoscopic hiatal hernia repair and Nissen fundoplication - 8/93/8101: Barium esophagram: Large hiatal hernia has markedly improved after fundoplication.  Contrast fills the esophagus and easily traverses across the GE junction.  Minimal dilation above the GE junction which may represent small residual hiatal hernia. - 08/13/2017: Barium esophagram: Nissen fundoplication.  Esophagus is diffusely dilated with poor peristalsis.  GE junction remains above the diaphragm.  Mild to moderate residual hiatal hernia.  Focal narrowing of the stomach at the level of the diaphragm.  Barium tablet extends into hiatal hernia but did not pass through the diaphragm into the stomach.  This is likely due to redundancy of the hiatal hernia. - 03/14/2020: RUQ [place]: Heterogenous liver suspicious for fatty  infiltration or hepatocellular disease.  Normal GB, no duct dilation  - 01/20/2022: Establish GI care in this clinic.  Reflux being treated with Nexium 40 mg daily, with second dose 2-3 times per week for breakthrough.  Sleeps with HOB elevated.  Has had recurrence of reflux symptoms despite prior Nissen fundoplication in 7510.  Separately with episodic nausea, lightheadedness, diarrhea since 03/2021.  Starting probiotic in 04/2021 with some improvement. - 02/04/2022: EGD: Short segment nondysplastic Barrett's Esophagus (C1-M2), 4 cm hiatal hernia, intact fundoplication but located cephalad to diaphragmatic hiatus consistent with hernia recurrence.  Antral gastritis (erosions, erythema, shallow ulcers) with bxs demonstrating gastritis with areas of intestinal metaplasia.  Normal duodenum (path benign).  Increased Nexium to 40 mg bid x4 weeks consideration for surgical referral.  Repeat upper endoscopy in 3 years for surveillance and GIM mapping  HPI:     Patient is a 76 y.o. female presenting to the Gastroenterology Clinic for follow-up.  Initially seen by me on 01/20/2022, and subsequently completed EGD earlier this month as outlined above.  Was started on high-dose PPI at the time of EGD.  She reports her reflux has improved since increasing PPI to BID. Less breakthrough and she would like to resume BID dosing for the time being.   Review of systems:     No chest pain, no SOB, no fevers, no urinary sx   Past Medical History:  Diagnosis Date   Barrett's esophagus    GERD (gastroesophageal reflux disease)    History of adenomatous polyp of colon    Hypertension     Patient's surgical history, family medical history, social history, medications and allergies were all reviewed in Epic    Current Outpatient Medications  Medication Sig Dispense Refill  alendronate (FOSAMAX) 70 MG tablet Take 1 tablet (70 mg total) by mouth every 7 (seven) days. Take with a full glass of water on an empty stomach.  4 tablet 3   amLODipine (NORVASC) 2.5 MG tablet Take 1 tablet by mouth once daily 90 tablet 0   atorvastatin (LIPITOR) 40 MG tablet Take 1 tablet (40 mg total) by mouth daily. 90 tablet 3   diclofenac Sodium (VOLTAREN) 1 % GEL Apply 2 g topically 4 (four) times daily. 100 g 2   esomeprazole (NEXIUM) 40 MG capsule Take 1 capsule (40 mg total) by mouth daily. Take an additional capsule as needed for breakthrough reflux. (Patient taking differently: 40 mg 2 (two) times daily before a meal. Take 1 capsule (40 mg total) by mouth daily. Take an additional capsule as needed for breakthrough reflux.) 60 capsule 5   losartan (COZAAR) 100 MG tablet Take 1 tablet by mouth once daily 90 tablet 0   polyvinyl alcohol (LIQUIFILM TEARS) 1.4 % ophthalmic solution Place 1 drop into both eyes as needed for dry eyes.     Probiotic Product (PROBIOTIC DAILY PO) Take 1 tablet by mouth daily.     rOPINIRole (REQUIP) 2 MG tablet Take 1 tablet (2 mg total) by mouth at bedtime. 90 tablet 2   VITAMIN D PO Take 1 capsule by mouth daily.     zolpidem (AMBIEN) 5 MG tablet Take 0.5-1 tablets (2.5-5 mg total) by mouth at bedtime as needed for sleep. 10 tablet 0   No current facility-administered medications for this visit.    Physical Exam:     BP (!) 140/78 (BP Location: Left Arm, Patient Position: Sitting, Cuff Size: Normal)   Pulse 63   Ht 5' 0.5" (1.537 m)   Wt 130 lb (59 kg)   SpO2 99%   BMI 24.97 kg/m   GENERAL:  Pleasant female in NAD PSYCH: : Cooperative, normal affect Musculoskeletal:  Normal muscle tone, normal strength NEURO: Alert and oriented x 3, no focal neurologic deficits   IMPRESSION and PLAN:    1) GERD 2) Nondysplastic, short segment Barrett's Esophagus 3) Hiatal hernia 4) History of Nissen fundoplication Reflux symptoms well controlled since increasing to high-dose PPI.  EGD with recurrence of hiatal hernia, but fundoplication wrap appears intact.  No erosive esophagitis.  - Continue  Nexium 40 mg BID for now - Discussed the role/utility of referral to surgery for hiatal hernia repair.  Given good clinical response to high-dose PPI and otherwise intact fundoplication, she is not interested in surgical repair at this time, and I agree that this is a reasonable decision - Continue antireflux lifestyle/dietary modifications - Repeat EGD in 3 years for Barrett's surveillance - Repeat BMP and micronutrient check in 1 year  5) Gastric intestinal metaplasia 6) Gastritis - Continue high-dose PPI as above - Repeat upper endoscopy with GIM mapping in 3 years  7) History of colon polyps - Due for repeat colonoscopy in 2025 for polyp surveillance.  Can discuss the role/utility of repeat colonoscopy at that time based on age, patient preference, comorbidities, and any active medical issues.  If planning to proceed with repeat colonoscopy, can conceivably schedule to be done at time of repeat upper endoscopy as above  RTC in 12 months   I spent 30 minutes of time, including in depth chart review, independent review of results as outlined above, communicating results with the patient directly, face-to-face time with the patient, coordinating care, and ordering studies and medications as appropriate, and documentation.  Lavena Bullion ,DO, FACG 03/04/2022, 11:51 AM

## 2022-03-04 NOTE — Patient Instructions (Signed)
If you are age 78 or older, your body mass index should be between 23-30. Your Body mass index is 24.97 kg/m. If this is out of the aforementioned range listed, please consider follow up with your Primary Care Provider.  __________________________________________________________  The [facility] providers would like to encourage you to use [facility] to communicate with providers for non-urgent requests or questions.  Due to long hold times on the telephone, sending your provider a message by [facility] may be a faster and more efficient way to get a response.  Please allow 48 business hours for a response.  Please remember that this is for non-urgent requests.    Please follow up in 12 months. Give [place] a call at [phone] to schedule an appointment.   Thank you for choosing me and [facility].  Vito Cirigliano, D.O.

## 2022-03-09 ENCOUNTER — Other Ambulatory Visit: Payer: Self-pay | Admitting: Family Medicine

## 2022-03-23 ENCOUNTER — Other Ambulatory Visit: Payer: Self-pay | Admitting: Family Medicine

## 2022-03-30 ENCOUNTER — Other Ambulatory Visit: Payer: Self-pay | Admitting: Family Medicine

## 2022-04-09 DIAGNOSIS — H524 Presbyopia: Secondary | ICD-10-CM | POA: Diagnosis not present

## 2022-05-08 ENCOUNTER — Other Ambulatory Visit: Payer: Self-pay | Admitting: Family Medicine

## 2022-05-22 DIAGNOSIS — H524 Presbyopia: Secondary | ICD-10-CM | POA: Diagnosis not present

## 2022-06-07 ENCOUNTER — Other Ambulatory Visit: Payer: Self-pay | Admitting: Family Medicine

## 2022-06-25 DIAGNOSIS — J189 Pneumonia, unspecified organism: Secondary | ICD-10-CM | POA: Diagnosis not present

## 2022-07-01 ENCOUNTER — Other Ambulatory Visit: Payer: Self-pay | Admitting: Family Medicine

## 2022-07-03 ENCOUNTER — Other Ambulatory Visit: Payer: Self-pay

## 2022-07-03 ENCOUNTER — Encounter (HOSPITAL_BASED_OUTPATIENT_CLINIC_OR_DEPARTMENT_OTHER): Payer: Self-pay | Admitting: Pediatrics

## 2022-07-03 ENCOUNTER — Emergency Department (HOSPITAL_BASED_OUTPATIENT_CLINIC_OR_DEPARTMENT_OTHER)
Admission: EM | Admit: 2022-07-03 | Discharge: 2022-07-03 | Disposition: A | Discharge status: Home / Self Care | Payer: Medicare Other | Attending: Emergency Medicine | Admitting: Emergency Medicine

## 2022-07-03 ENCOUNTER — Emergency Department (HOSPITAL_BASED_OUTPATIENT_CLINIC_OR_DEPARTMENT_OTHER): Payer: Medicare Other

## 2022-07-03 DIAGNOSIS — M25532 Pain in left wrist: Secondary | ICD-10-CM | POA: Diagnosis not present

## 2022-07-03 MED ORDER — METHYLPREDNISOLONE 4 MG PO TBPK: ORAL_TABLET | ORAL | 0 refills | Status: DC

## 2022-07-03 MED ORDER — OXYCODONE HCL 5 MG PO TABS: 5.0000 mg | ORAL_TABLET | Freq: Three times a day (TID) | ORAL | 0 refills | Status: DC | PRN

## 2022-07-03 MED ORDER — OXYCODONE HCL 5 MG PO TABS
5.0000 mg | ORAL_TABLET | Freq: Once | ORAL | Status: AC
  Administered 2022-07-03: 5 mg via ORAL
  Filled 2022-07-03: qty 1

## 2022-07-03 NOTE — ED Notes (Signed)
ED Provider at bedside. 

## 2022-07-03 NOTE — ED Triage Notes (Signed)
C/o left wrist pain and swelling; denies any recent injury;

## 2022-07-03 NOTE — ED Notes (Signed)
Pt provided discharge instructions and prescription information. Pt was given the opportunity to ask questions and questions were answered.   Pt teaching provided on medications that may cause drowsiness. Pt instructed not to drive or operate heavy machinery while taking the prescribed medication. Pt verbalized understanding.

## 2022-07-03 NOTE — Discharge Instructions (Addendum)
Recommend 1000 mg of Tylenol every 6 hours as needed for pain.  Take Medrol Dosepak as prescribed.  I prescribed you narcotic pain medicine called Roxicodone.  Please use this medication carefully as it is sedating.  Do not use if doing dangerous activities including driving.

## 2022-07-03 NOTE — ED Provider Notes (Signed)
[facility] EMERGENCY DEPARTMENT Provider Note   CSN: [id number] Arrival date & time: 07/03/22  1408     History  Chief Complaint  Patient presents with   Wrist Pain    Sheila Rice is a 76 y.o. female.  Patient here with pain to the left wrist for the last several days.  History of some arthritis issues in the past.  May be some overuse process.  No direct fall.  Denies any fever or chills.  Nothing makes it worse or better.  Denies any redness.  Denies any chest pain or shortness of breath.  No history of gout.  History of hypertension otherwise.  The history is provided by the patient.       Home Medications Prior to Admission medications   Medication Sig Start Date End Date Taking? Authorizing Provider  methylPREDNISolone (MEDROL DOSEPAK) 4 MG TBPK tablet Follow package insert 07/03/22  Yes Shery Wauneka, DO  oxyCODONE (ROXICODONE) 5 MG immediate release tablet Take 1 tablet (5 mg total) by mouth every 8 (eight) hours as needed for up to 6 doses for breakthrough pain. 07/03/22  Yes Tanikka Bresnan, DO  alendronate (FOSAMAX) 70 MG tablet Take 1 tablet (70 mg total) by mouth every 7 (seven) days. Take with a full glass of water on an empty stomach. 12/18/20   Shelda Pal, DO  amLODipine (NORVASC) 2.5 MG tablet Take 1 tablet by mouth once daily 06/08/22   Shelda Pal, DO  atorvastatin (LIPITOR) 40 MG tablet Take 1 tablet by mouth once daily 05/08/22   Shelda Pal, DO  diclofenac Sodium (VOLTAREN) 1 % GEL Apply 2 g topically 4 (four) times daily. 03/15/20   Nita Sells, MD  esomeprazole (NEXIUM) 40 MG capsule Take 1 capsule (40 mg total) by mouth daily. Take an additional capsule as needed for breakthrough reflux. Patient taking differently: 40 mg 2 (two) times daily before a meal. Take 1 capsule (40 mg total) by mouth daily. Take an additional capsule as needed for breakthrough reflux. 01/27/22   Cirigliano, Vito V, DO  losartan  (COZAAR) 100 MG tablet Take 1 tablet by mouth once daily 03/23/22   Nani Ravens, Crosby Oyster, DO  polyvinyl alcohol (LIQUIFILM TEARS) 1.4 % ophthalmic solution Place 1 drop into both eyes as needed for dry eyes.    [provider]  Probiotic Product (PROBIOTIC DAILY PO) Take 1 tablet by mouth daily.    [provider]  rOPINIRole (REQUIP) 2 MG tablet TAKE 1 TABLET BY MOUTH AT BEDTIME 07/01/22   Wendling, Crosby Oyster, DO  VITAMIN D PO Take 1 capsule by mouth daily.    [provider]  zolpidem (AMBIEN) 5 MG tablet Take 0.5-1 tablets (2.5-5 mg total) by mouth at bedtime as needed for sleep. 01/07/21   Shelda Pal, DO      Allergies    Ace inhibitors    Review of Systems   Review of Systems  Physical Exam Updated Vital Signs BP (!) 196/81 (BP Location: Right Arm)   Pulse 80   Temp 99 F (37.2 C) (Oral)   Resp 18   Ht 5' 0.5" (1.537 m)   Wt 56.7 kg   SpO2 98%   BMI 24.01 kg/m  Physical Exam Vitals and nursing note reviewed.  Constitutional:      General: She is not in acute distress.    Appearance: She is well-developed. She is not ill-appearing.  HENT:     Head: Normocephalic and atraumatic.  Eyes:     Conjunctiva/sclera: Conjunctivae normal.  Cardiovascular:     Rate and Rhythm: Normal rate and regular rhythm.     Heart sounds: No murmur heard. Pulmonary:     Effort: Pulmonary effort is normal. No respiratory distress.     Breath sounds: Normal breath sounds.  Abdominal:     Palpations: Abdomen is soft.     Tenderness: There is no abdominal tenderness.  Musculoskeletal:        General: Tenderness present. No deformity. Normal range of motion.     Cervical back: Neck supple.     Comments: Tenderness to the left wrist with a little bit of swelling but there is no redness or erythema, good range of motion of the wrist without much resistance or difficulty  Skin:    General: Skin is warm and dry.     Capillary Refill: Capillary refill  takes less than 2 seconds.     Findings: No erythema.  Neurological:     General: No focal deficit present.     Mental Status: She is alert.     Sensory: No sensory deficit.     Motor: No weakness.  Psychiatric:        Mood and Affect: Mood normal.     ED Results / Procedures / Treatments   Labs (all labs ordered are listed, but only abnormal results are displayed) Labs Reviewed - No data to display  EKG None  Radiology DG Wrist Complete Left  Result Date: 07/03/2022 CLINICAL DATA:  Left wrist pain and swelling without recent injury. EXAM: LEFT WRIST - COMPLETE 3+ VIEW COMPARISON:  None Available. FINDINGS: There is no evidence of fracture or dislocation. Severe degenerative changes seen involving the first carpometacarpal joint. Soft tissues are unremarkable. IMPRESSION: Severe osteoarthritis of first carpometacarpal joint. No acute abnormality is noted. Electronically Signed   By: Marijo Conception M.D.   On: 07/03/2022 14:45    Procedures Procedures    Medications Ordered in ED Medications  oxyCODONE (Oxy IR/ROXICODONE) immediate release tablet 5 mg (5 mg Oral Given 07/03/22 1532)    ED Course/ Medical Decision Making/ A&P                           Medical Decision Making Amount and/or Complexity of Data Reviewed Radiology: ordered.  Risk Prescription drug management.   Sheila Rice is here with left wrist pain.  Normal vitals.  No fever.  No significant medical history.  She is got some pain and swelling to the left wrist.  There is no warmth erythema.  She is able to range the wrist without much discomfort.  Differential diagnosis likely inflammatory process.  Unlikely there is a fracture.  Have no concern for septic joint.  X-ray was obtained that showed no fracture or dislocation.  Overall she has severe osteoarthritis of the first carpometacarpal joint which is where she is most tender.  Otherwise suspect this is inflammatory.  Will prescribe oxycodone for  breakthrough pain but prescribe Medrol Dosepak, Tylenol and put her in a removable thumb spica splint and have her follow-up with hand.  Discharged in good condition.  She is neurovascular neuromuscular intact.  This chart was dictated using voice recognition software.  Despite best efforts to proofread,  errors can occur which can change the documentation meaning.         Final Clinical Impression(s) / ED Diagnoses Final diagnoses:  Left wrist pain  Rx / DC Orders ED Discharge Orders          Ordered    oxyCODONE (ROXICODONE) 5 MG immediate release tablet  Every 8 hours PRN        07/03/22 1533    methylPREDNISolone (MEDROL DOSEPAK) 4 MG TBPK tablet        07/03/22 1533              Takeya Marquis, DO 07/03/22 1533

## 2022-07-09 ENCOUNTER — Other Ambulatory Visit: Payer: Self-pay | Admitting: Family Medicine

## 2022-07-10 ENCOUNTER — Other Ambulatory Visit: Payer: Self-pay

## 2022-07-10 ENCOUNTER — Emergency Department (HOSPITAL_BASED_OUTPATIENT_CLINIC_OR_DEPARTMENT_OTHER): Payer: Medicare Other

## 2022-07-10 ENCOUNTER — Encounter (HOSPITAL_BASED_OUTPATIENT_CLINIC_OR_DEPARTMENT_OTHER): Payer: Self-pay | Admitting: Emergency Medicine

## 2022-07-10 ENCOUNTER — Emergency Department (HOSPITAL_BASED_OUTPATIENT_CLINIC_OR_DEPARTMENT_OTHER)
Admission: EM | Admit: 2022-07-10 | Discharge: 2022-07-10 | Disposition: A | Discharge status: Home / Self Care | Payer: Medicare Other | Attending: Emergency Medicine | Admitting: Emergency Medicine

## 2022-07-10 DIAGNOSIS — M25551 Pain in right hip: Secondary | ICD-10-CM

## 2022-07-10 DIAGNOSIS — M79605 Pain in left leg: Secondary | ICD-10-CM | POA: Diagnosis not present

## 2022-07-10 DIAGNOSIS — M79604 Pain in right leg: Secondary | ICD-10-CM

## 2022-07-10 DIAGNOSIS — Z79899 Other long term (current) drug therapy: Secondary | ICD-10-CM | POA: Diagnosis not present

## 2022-07-10 DIAGNOSIS — I10 Essential (primary) hypertension: Secondary | ICD-10-CM | POA: Diagnosis not present

## 2022-07-10 DIAGNOSIS — R102 Pelvic and perineal pain: Secondary | ICD-10-CM | POA: Diagnosis not present

## 2022-07-10 DIAGNOSIS — W19XXXA Unspecified fall, initial encounter: Secondary | ICD-10-CM

## 2022-07-10 DIAGNOSIS — M79651 Pain in right thigh: Secondary | ICD-10-CM | POA: Diagnosis not present

## 2022-07-10 DIAGNOSIS — W010XXA Fall on same level from slipping, tripping and stumbling without subsequent striking against object, initial encounter: Secondary | ICD-10-CM | POA: Insufficient documentation

## 2022-07-10 DIAGNOSIS — R269 Unspecified abnormalities of gait and mobility: Secondary | ICD-10-CM | POA: Diagnosis not present

## 2022-07-10 MED ORDER — CYCLOBENZAPRINE HCL 10 MG PO TABS: 10.0000 mg | ORAL_TABLET | Freq: Two times a day (BID) | ORAL | 0 refills | Status: DC | PRN

## 2022-07-10 MED ORDER — OXYCODONE-ACETAMINOPHEN 5-325 MG PO TABS: 1.0000 | ORAL_TABLET | Freq: Four times a day (QID) | ORAL | 0 refills | Status: DC | PRN

## 2022-07-10 MED ORDER — ACETAMINOPHEN 500 MG PO TABS
1000.0000 mg | ORAL_TABLET | Freq: Once | ORAL | Status: AC
  Administered 2022-07-10: 1000 mg via ORAL
  Filled 2022-07-10: qty 2

## 2022-07-10 NOTE — Discharge Instructions (Addendum)
Your imaging was reassuring today.  Please take tylenol/ibuprofen or Percocet as needed for pain. I recommend close follow-up with orthopedics for reevaluation.  Please do not hesitate to return to emergency department if worrisome signs symptoms we discussed become apparent.

## 2022-07-10 NOTE — ED Provider Notes (Signed)
[facility] EMERGENCY DEPARTMENT Provider Note   CSN: [id number] Arrival date & time: 07/10/22  4081     History  Chief Complaint  Patient presents with   Sheila Rice    ITZELL Rice is a 77 y.o. female with a past medical history of hypertension, GERD presenting to the emergency room for evaluation after fall.  Patient reports she was dancing this morning when she fell and landed on her buttocks.  She reports hitting her head but it was a minimal impact.  Denies loss of consciousness.  Patient reports she was able to ambulate after falling.  She reports at the moment she is not able to bear weight on her right leg.  She complains of the pain behind her right leg.  She denies any knee pain.   Fall    Past Medical History:  Diagnosis Date   Barrett's esophagus    GERD (gastroesophageal reflux disease)    History of adenomatous polyp of colon    Hypertension    Past Surgical History:  Procedure Laterality Date   BACK SURGERY     Spinal Stenosis   BIOPSY  03/15/2020   Procedure: BIOPSY;  Surgeon: Otis Brace, MD;  Location: [facility];  Service: Gastroenterology;;   BUNIONECTOMY     COLONOSCOPY     ESOPHAGOGASTRODUODENOSCOPY (EGD) WITH PROPOFOL N/A 03/15/2020   Procedure: ESOPHAGOGASTRODUODENOSCOPY (EGD) WITH PROPOFOL;  Surgeon: Otis Brace, MD;  Location: [facility];  Service: Gastroenterology;  Laterality: N/A;   LAPAROSCOPIC NISSEN FUNDOPLICATION N/A 44/81/8563   Procedure: LAPAROSCOPIC NISSEN FUNDOPLICATION and HIATAL HERNIA REPAIR;  Surgeon: Jackolyn Confer, MD;  Location: [facility];  Service: General;  Laterality: N/A;   UPPER GASTROINTESTINAL ENDOSCOPY       Home Medications Prior to Admission medications   Medication Sig Start Date End Date Taking? Authorizing Provider  cyclobenzaprine (FLEXERIL) 10 MG tablet Take 1 tablet (10 mg total) by mouth 2 (two) times daily as needed for muscle spasms. 07/10/22  Yes Rex Kras, PA  oxyCODONE-acetaminophen  (PERCOCET/ROXICET) 5-325 MG tablet Take 1 tablet by mouth every 6 (six) hours as needed for severe pain. 07/10/22  Yes Rex Kras, PA  alendronate (FOSAMAX) 70 MG tablet Take 1 tablet (70 mg total) by mouth every 7 (seven) days. Take with a full glass of water on an empty stomach. 12/18/20   Shelda Pal, DO  amLODipine (NORVASC) 2.5 MG tablet Take 1 tablet by mouth once daily 06/08/22   Shelda Pal, DO  atorvastatin (LIPITOR) 40 MG tablet Take 1 tablet by mouth once daily 05/08/22   Shelda Pal, DO  diclofenac Sodium (VOLTAREN) 1 % GEL Apply 2 g topically 4 (four) times daily. 03/15/20   Nita Sells, MD  esomeprazole (NEXIUM) 40 MG capsule Take 1 capsule (40 mg total) by mouth daily. Take an additional capsule as needed for breakthrough reflux. Patient taking differently: 40 mg 2 (two) times daily before a meal. Take 1 capsule (40 mg total) by mouth daily. Take an additional capsule as needed for breakthrough reflux. 01/27/22   Cirigliano, Vito V, DO  losartan (COZAAR) 100 MG tablet Take 1 tablet by mouth once daily 07/09/22   Nani Ravens, Crosby Oyster, DO  methylPREDNISolone (MEDROL DOSEPAK) 4 MG TBPK tablet Follow package insert 07/03/22   Curatolo, Adam, DO  oxyCODONE (ROXICODONE) 5 MG immediate release tablet Take 1 tablet (5 mg total) by mouth every 8 (eight) hours as needed for up to 6 doses for breakthrough pain. 07/03/22   Lennice Sites,  DO  polyvinyl alcohol (LIQUIFILM TEARS) 1.4 % ophthalmic solution Place 1 drop into both eyes as needed for dry eyes.    [provider]  Probiotic Product (PROBIOTIC DAILY PO) Take 1 tablet by mouth daily.    [provider]  rOPINIRole (REQUIP) 2 MG tablet TAKE 1 TABLET BY MOUTH AT BEDTIME 07/01/22   Wendling, Crosby Oyster, DO  VITAMIN D PO Take 1 capsule by mouth daily.    [provider]  zolpidem (AMBIEN) 5 MG tablet Take 0.5-1 tablets (2.5-5 mg total) by mouth at bedtime as needed for sleep.  01/07/21   Shelda Pal, DO      Allergies    Ace inhibitors    Review of Systems   Review of Systems  Physical Exam Updated Vital Signs BP (!) 175/73 (BP Location: Left Arm)   Pulse 79   Temp 98.5 F (36.9 C)   Resp 18   Ht 5' (1.524 m)   Wt 57.6 kg   SpO2 99%   BMI 24.80 kg/m  Physical Exam Vitals and nursing note reviewed.  Constitutional:      Appearance: Normal appearance.  HENT:     Head: Normocephalic and atraumatic.     Mouth/Throat:     Mouth: Mucous membranes are moist.  Eyes:     General: No scleral icterus. Cardiovascular:     Rate and Rhythm: Normal rate and regular rhythm.     Pulses: Normal pulses.     Heart sounds: Normal heart sounds.  Pulmonary:     Effort: Pulmonary effort is normal.     Breath sounds: Normal breath sounds.  Abdominal:     General: Abdomen is flat.     Palpations: Abdomen is soft.     Tenderness: There is no abdominal tenderness.  Musculoskeletal:        General: No deformity.     Comments: TTP to buttock area. No hematoma, abrasion or skin laceration noted.  Skin:    General: Skin is warm.     Findings: No rash.  Neurological:     General: No focal deficit present.     Mental Status: She is alert.  Psychiatric:        Mood and Affect: Mood normal.     ED Results / Procedures / Treatments   Labs (all labs ordered are listed, but only abnormal results are displayed) Labs Reviewed - No data to display  EKG None  Radiology CT FEMUR RIGHT WO CONTRAST  Result Date: 07/10/2022 CLINICAL DATA:  Upper leg trauma unable to bear weight on R leg EXAM: CT OF THE LOWER RIGHT EXTREMITY WITHOUT CONTRAST TECHNIQUE: Multidetector CT imaging of the right lower extremity was performed according to the standard protocol. RADIATION DOSE REDUCTION: This exam was performed according to the departmental dose-optimization program which includes automated exposure control, adjustment of the mA and/or kV according to patient size  and/or use of iterative reconstruction technique. COMPARISON:  Radiographs 07/10/2022, 11/04/2009 FINDINGS: Bones/Joint/Cartilage Chronic right parasymphyseal, superior and inferior pubic rami fractures. There is no evidence of acute fracture. Alignment is normal. No aggressive osseous lesion. There are mild degenerative changes of the right hip. There are posttraumatic degenerative changes of the pubic symphysis. Trace right knee effusion. Tricompartment osteoarthritis of the right knee with moderate medial joint space narrowing. Ligaments Suboptimally assessed by CT. Muscles and Tendons No acute myotendinous abnormality by CT.  No fluid collection. Soft tissues No focal fluid collection. IMPRESSION: No acute osseous abnormality. Chronic right superior  and inferior pubic rami fractures. Mild right hip osteoarthritis. Mild to moderate tricompartment osteoarthritis of the right knee. Electronically Signed   By: Maurine Simmering M.D.   On: 07/10/2022 11:35   DG FEMUR, MIN 2 VIEWS RIGHT  Result Date: 07/10/2022 CLINICAL DATA:  Fall yesterday while dancing.  Pain. EXAM: RIGHT FEMUR 2 VIEWS COMPARISON:  None Available. FINDINGS: There is no evidence of fracture or other focal bone lesions. The cortical margins of the femur are intact. Hip and knee alignment are maintained. Soft tissues are unremarkable. IMPRESSION: No fracture of the right femur. Electronically Signed   By: Keith Rake M.D.   On: 07/10/2022 10:47   DG Pelvis 1-2 Views  Result Date: 07/10/2022 CLINICAL DATA:  Status post fall yesterday while dancing.  Pain. EXAM: PELVIS - 1-2 VIEW COMPARISON:  None Available. FINDINGS: Remote healed fracture is of the right superior and inferior pubic rami. No evidence of acute pelvic fracture. Degenerative change of the sacroiliac joints. No symphyseal or sacroiliac diastasis. Mild bilateral hip joint space narrowing. Unremarkable soft tissues. Surgical hardware in the lower lumbar spine is partially included.  IMPRESSION: 1. No acute fracture of the pelvis. 2. Remote healed right superior and inferior pubic rami fractures. Electronically Signed   By: Keith Rake M.D.   On: 07/10/2022 10:46    Procedures Procedures    Medications Ordered in ED Medications  acetaminophen (TYLENOL) tablet 1,000 mg (1,000 mg Oral Given 07/10/22 1209)    ED Course/ Medical Decision Making/ A&P                           Medical Decision Making Amount and/or Complexity of Data Reviewed Radiology: ordered.  Risk OTC drugs. Prescription drug management.   This patient presents to the ED for evaluation after fall, this involves an extensive number of treatment options, and is a complaint that carries with a high risk of complications and morbidity.  The differential diagnosis includes hip fracture, dislocation, femur fracture, dislocation, musculoskeletal pain.  This is not an exhaustive list.  Lab tests:   Imaging studies: I ordered imaging studies. I personally reviewed, interpreted imaging and agree with the radiologist's interpretations. The results include: CT of right hip and femur show no acute osseous abnormality. Chronic right superior and inferior pubic rami fractures. Mild right hip osteoarthritis. Mild to moderate tricompartment osteoarthritis of the right knee.   Problem list/ ED course/ Critical interventions/ Medical management: HPI: See above Vital signs within normal range and stable throughout visit. Laboratory/imaging studies significant for: See above. On physical examination, patient is afebrile and appears in no acute distress.  There was tenderness to palpation to the hip.  CT scan of the hip and femur show no acute osseous abnormalities.  There was chronic right superior and inferior pubic rami fractures. Mild right hip osteoarthritis. Mild to moderate tricompartment osteoarthritis of the right knee.  Based on patient's clinical presentations and laboratory/imaging studies I suspect  musculoskeletal pain. Patient was able to ambulate in the room with some difficulty. Advised patient to use crutches for ambulation in the first few days.  Applies ice and heat to pain area.  Take Tylenol/ibuprofen for pain.  Follow-up with primary care physician for further evaluation and management.  Return to the ER if new or worsening symptoms. I have reviewed the patient home medicines and have made adjustments as needed.  Cardiac monitoring/EKG: The patient was maintained on a cardiac monitor.  I personally reviewed and  interpreted the cardiac monitor which showed an underlying rhythm of: sinus rhythm.  Additional history obtained: External records from outside source obtained and reviewed including: Chart review including previous notes, labs, imaging.  Consultations obtained: I requested consultation with Dr. Pearline Cables, and discussed lab and imaging findings as well as pertinent plan.  He/she agrees with the plan.  Disposition Continued outpatient therapy. Follow-up with PCP recommended for reevaluation of symptoms. Treatment plan discussed with patient.  Pt acknowledged understanding was agreeable to the plan. Worrisome signs and symptoms were discussed with patient, and patient acknowledged understanding to return to the ED if they noticed these signs and symptoms. Patient was stable upon discharge.   This chart was dictated using voice recognition software.  Despite best efforts to proofread,  errors can occur which can change the documentation meaning.          Final Clinical Impression(s) / ED Diagnoses Final diagnoses:  Fall, initial encounter  Right leg pain  Right hip pain    Rx / DC Orders ED Discharge Orders          Ordered    oxyCODONE-acetaminophen (PERCOCET/ROXICET) 5-325 MG tablet  Every 6 hours PRN        07/10/22 1315    cyclobenzaprine (FLEXERIL) 10 MG tablet  2 times daily PRN        07/10/22 1319              Rex Kras, [place] 07/10/22 1838    Jeanell Sparrow, DO 07/13/22 2036

## 2022-07-10 NOTE — ED Triage Notes (Signed)
Pt arrives pov, to triage in wheelchair, endorses backwards fall yesterday "while dancing". Denies thinners or head injury, reports landing on buttocks. Denies neck pain. Also endorses RT leg pain

## 2022-07-13 ENCOUNTER — Encounter: Payer: Self-pay | Admitting: Family Medicine

## 2022-07-13 ENCOUNTER — Ambulatory Visit (INDEPENDENT_AMBULATORY_CARE_PROVIDER_SITE_OTHER): Payer: Medicare Other | Admitting: Family Medicine

## 2022-07-13 VITALS — BP 128/86 | HR 82 | Temp 98.0°F | Resp 16 | Ht 60.5 in | Wt 127.8 lb

## 2022-07-13 DIAGNOSIS — M810 Age-related osteoporosis without current pathological fracture: Secondary | ICD-10-CM | POA: Diagnosis not present

## 2022-07-13 DIAGNOSIS — E785 Hyperlipidemia, unspecified: Secondary | ICD-10-CM | POA: Diagnosis not present

## 2022-07-13 DIAGNOSIS — Z0000 Encounter for general adult medical examination without abnormal findings: Secondary | ICD-10-CM | POA: Diagnosis not present

## 2022-07-13 DIAGNOSIS — Z23 Encounter for immunization: Secondary | ICD-10-CM

## 2022-07-13 LAB — COMPREHENSIVE METABOLIC PANEL
ALT: 16 U/L (ref 0–35)
AST: 20 U/L (ref 0–37)
Albumin: 3.9 g/dL (ref 3.5–5.2)
Alkaline Phosphatase: 77 U/L (ref 39–117)
BUN: 11 mg/dL (ref 6–23)
CO2: 28 mEq/L (ref 19–32)
Calcium: 9.3 mg/dL (ref 8.4–10.5)
Chloride: 99 mEq/L (ref 96–112)
Creatinine, Ser: 0.64 mg/dL (ref 0.40–1.20)
GFR: 84.44 mL/min (ref >60.00)
Glucose, Bld: 97 mg/dL (ref 70–99)
Potassium: 4.1 mEq/L (ref 3.5–5.1)
Sodium: 138 mEq/L (ref 135–145)
Total Bilirubin: 0.6 mg/dL (ref 0.2–1.2)
Total Protein: 6.7 g/dL (ref 6.0–8.3)

## 2022-07-13 LAB — LIPID PANEL
Cholesterol: 221 mg/dL — ABNORMAL HIGH (ref 0–200)
HDL: 77.5 mg/dL (ref >39.00)
LDL Cholesterol: 122 mg/dL — ABNORMAL HIGH (ref 0–99)
NonHDL: 143.76
Total CHOL/HDL Ratio: 3
Triglycerides: 111 mg/dL (ref 0.0–149.0)
VLDL: 22.2 mg/dL (ref 0.0–40.0)

## 2022-07-13 LAB — VITAMIN D 25 HYDROXY (VIT D DEFICIENCY, FRACTURES): VITD: 34.35 ng/mL (ref 30.00–100.00)

## 2022-07-13 MED ORDER — ALENDRONATE SODIUM 70 MG PO TABS: 70.0000 mg | ORAL_TABLET | ORAL | 3 refills | Status: DC

## 2022-07-13 NOTE — Patient Instructions (Addendum)
Give [place] 2-3 business days to get the results of your labs back.   Keep the diet clean and stay active.  Please get me a copy of your advanced directive form at your convenience.   Please consider getting the 2nd shingles shot at the pharmacy.  Please consider getting your tetanus booster at the pharmacy.  You would get the RSV and COVID vaccines at the pharmacy as well.   Let us know if you need anything.

## 2022-07-13 NOTE — Progress Notes (Signed)
Chief Complaint  Patient presents with   Annual Exam     Well Woman Sheila Rice is here for a complete physical.   Her last physical was >1 year ago.  Current diet: in general, a "healthy" diet. Current exercise: lifting wts, cycling. Weight is stable and she denies daytime fatigue. Seatbelt? Yes Advanced directive? No  Health Maintenance Shingrix- Due for #2 DEXA- Yes Tetanus- No Pneumonia- Yes Hep C screen- Yes  Past Medical History:  Diagnosis Date   Barrett's esophagus    GERD (gastroesophageal reflux disease)    History of adenomatous polyp of colon    Hypertension      Past Surgical History:  Procedure Laterality Date   BACK SURGERY     Spinal Stenosis   BIOPSY  03/15/2020   Procedure: BIOPSY;  Surgeon: Otis Brace, MD;  Location: [facility];  Service: Gastroenterology;;   BUNIONECTOMY     COLONOSCOPY     ESOPHAGOGASTRODUODENOSCOPY (EGD) WITH PROPOFOL N/A 03/15/2020   Procedure: ESOPHAGOGASTRODUODENOSCOPY (EGD) WITH PROPOFOL;  Surgeon: Otis Brace, MD;  Location: [facility];  Service: Gastroenterology;  Laterality: N/A;   LAPAROSCOPIC NISSEN FUNDOPLICATION N/A 81/15/7262   Procedure: LAPAROSCOPIC NISSEN FUNDOPLICATION and HIATAL HERNIA REPAIR;  Surgeon: Jackolyn Confer, MD;  Location: [facility];  Service: General;  Laterality: N/A;   UPPER GASTROINTESTINAL ENDOSCOPY      Medications  Current Outpatient Medications on File Prior to Visit  Medication Sig Dispense Refill   alendronate (FOSAMAX) 70 MG tablet Take 1 tablet (70 mg total) by mouth every 7 (seven) days. Take with a full glass of water on an empty stomach. 4 tablet 3   amLODipine (NORVASC) 2.5 MG tablet Take 1 tablet by mouth once daily 90 tablet 0   atorvastatin (LIPITOR) 40 MG tablet Take 1 tablet by mouth once daily 90 tablet 0   diclofenac Sodium (VOLTAREN) 1 % GEL Apply 2 g topically 4 (four) times daily. 100 g 2   esomeprazole (NEXIUM) 40 MG capsule Take 1 capsule (40 mg total)  by mouth daily. Take an additional capsule as needed for breakthrough reflux. (Patient taking differently: 40 mg 2 (two) times daily before a meal. Take 1 capsule (40 mg total) by mouth daily. Take an additional capsule as needed for breakthrough reflux.) 60 capsule 5   losartan (COZAAR) 100 MG tablet Take 1 tablet by mouth once daily 90 tablet 0   oxyCODONE-acetaminophen (PERCOCET/ROXICET) 5-325 MG tablet Take 1 tablet by mouth every 6 (six) hours as needed for severe pain. 6 tablet 0   polyvinyl alcohol (LIQUIFILM TEARS) 1.4 % ophthalmic solution Place 1 drop into both eyes as needed for dry eyes.     Probiotic Product (PROBIOTIC DAILY PO) Take 1 tablet by mouth daily.     rOPINIRole (REQUIP) 2 MG tablet TAKE 1 TABLET BY MOUTH AT BEDTIME 90 tablet 0   VITAMIN D PO Take 1 capsule by mouth daily.     zolpidem (AMBIEN) 5 MG tablet Take 0.5-1 tablets (2.5-5 mg total) by mouth at bedtime as needed for sleep. 10 tablet 0   Allergies Allergies  Allergen Reactions   Ace Inhibitors Cough    Review of Systems: Constitutional:  no fevers Eye:  no recent significant change in vision Ears:  No changes in hearing Nose/Mouth/Throat:  no complaints of nasal congestion, no sore throat Cardiovascular: no chest pain Respiratory:  No shortness of breath Gastrointestinal:  No change in bowel habits GU:  Female: negative for dysuria Integumentary:  no abnormal skin  lesions reported Neurologic:  no headaches Endocrine:  denies unexplained weight changes  Exam BP 128/86   Pulse 82   Temp 98 F (36.7 C)   Resp 16   Ht 5' 0.5" (1.537 m)   Wt 127 lb 12.8 oz (58 kg)   SpO2 99%   BMI 24.55 kg/m  General:  well developed, well nourished, in no apparent distress Skin:  no significant moles, warts, or growths Head:  no masses, lesions, or tenderness Eyes:  pupils equal and round, sclera anicteric without injection Ears:  canals without lesions, TMs shiny without retraction, no obvious effusion, no  erythema Nose:  nares patent, mucosa normal, and no drainage Throat/Pharynx:  lips and gingiva without lesion; tongue and uvula midline; non-inflamed pharynx; no exudates or postnasal drainage Neck: neck supple without adenopathy, thyromegaly, or masses Lungs:  clear to auscultation, breath sounds equal bilaterally, no respiratory distress Cardio:  regular rate and rhythm, no bruits or LE edema Abdomen:  abdomen soft, nontender; bowel sounds normal; no masses or organomegaly Genital: Deferred Neuro:  gait antalgic from a fall a few days ago; deep tendon reflexes normal and symmetric Psych: well oriented with normal range of affect and appropriate judgment/insight  Assessment and Plan  Well adult exam  Need for influenza vaccination - Plan: Flu Vaccine QUAD High Dose(Fluad), CANCELED: Flu Vaccine QUAD 36+ mos IM (Fluarix, Fluzone & Afluria Quad PF  Hyperlipidemia, unspecified hyperlipidemia type - Plan: Comprehensive metabolic panel, Lipid panel  Age-related osteoporosis without current pathological fracture - Plan: VITAMIN D 25 Hydroxy (Vit-D Deficiency, Fractures)   Well 77 y.o. female. Counseled on diet and exercise. Other orders as above. Advanced directive form provided today.  Tdap, Shingrix, RSV, and covid vaccinations recommended. Flu shot today. Follow up in 6 mo or prn. The patient voiced understanding and agreement to the plan.  [place], DO 07/13/22 9:24 AM

## 2022-07-28 ENCOUNTER — Encounter: Payer: Self-pay | Admitting: Family Medicine

## 2022-08-07 ENCOUNTER — Other Ambulatory Visit: Payer: Self-pay | Admitting: Gastroenterology

## 2022-09-06 ENCOUNTER — Other Ambulatory Visit: Payer: Self-pay | Admitting: Family Medicine

## 2022-09-16 DIAGNOSIS — H109 Unspecified conjunctivitis: Secondary | ICD-10-CM | POA: Diagnosis not present

## 2022-09-29 ENCOUNTER — Other Ambulatory Visit: Payer: Self-pay | Admitting: Family Medicine

## 2022-10-16 ENCOUNTER — Other Ambulatory Visit: Payer: Self-pay | Admitting: Family Medicine

## 2022-10-21 DIAGNOSIS — K080 Exfoliation of teeth due to systemic causes: Secondary | ICD-10-CM | POA: Diagnosis not present

## 2022-12-01 DIAGNOSIS — K080 Exfoliation of teeth due to systemic causes: Secondary | ICD-10-CM | POA: Diagnosis not present

## 2023-01-11 ENCOUNTER — Encounter: Payer: Self-pay | Admitting: Family Medicine

## 2023-01-11 ENCOUNTER — Ambulatory Visit (INDEPENDENT_AMBULATORY_CARE_PROVIDER_SITE_OTHER): Payer: Medicare Other | Admitting: Family Medicine

## 2023-01-11 VITALS — BP 128/82 | HR 71 | Temp 99.0°F | Ht 60.0 in | Wt 126.4 lb

## 2023-01-11 DIAGNOSIS — I10 Essential (primary) hypertension: Secondary | ICD-10-CM

## 2023-01-11 DIAGNOSIS — E785 Hyperlipidemia, unspecified: Secondary | ICD-10-CM

## 2023-01-11 DIAGNOSIS — M810 Age-related osteoporosis without current pathological fracture: Secondary | ICD-10-CM

## 2023-01-11 NOTE — Patient Instructions (Signed)
Keep the diet clean and stay active.  The Shingrix vaccine (for shingles) is a 2 shot series spaced 2-6 months apart. It can make people feel low energy, achy and almost like they have the flu for 48 hours after injection. 1/5 people can have nausea and/or vomiting. Please plan accordingly when deciding on when to get this shot. Call our office for a nurse visit appointment to get this. The second shot of the series is less severe regarding the side effects, but it still lasts 48 hours.   Please find out when your last tetanus shot was and let us know.   Continue calcium and vitamin D supplementation.   Let us know if you need anything.

## 2023-01-11 NOTE — Progress Notes (Signed)
Chief Complaint  Patient presents with   Follow-up    6 month     Subjective Sheila Rice is a 77 y.o. female who presents for hypertension follow up. She does not monitor home blood pressures. She is compliant with medications- Norvasc 2.5 mg/d. Patient has these side effects of medication: none She is usually adhering to a healthy diet overall. Current exercise: active at work No CP or SOB.   Hyperlipidemia Patient presents for dyslipidemia follow up. Currently being treated with Lipitor 40 mg/d and compliance with treatment thus far has been good. She denies myalgias. Diet/exercise as above.  The patient is not known to have coexisting coronary artery disease.  Osteoporosis On Fosamax 70 mg/week. Started 12/18/20. Will stop 12/18/25 and then reck DEXA.  Compliant, no AE's.  Takes Ca/Vit D. Exercise as above.    Past Medical History:  Diagnosis Date   Barrett's esophagus    GERD (gastroesophageal reflux disease)    History of adenomatous polyp of colon    Hypertension     Exam BP 128/82 (BP Location: Left Arm, Patient Position: Sitting, Cuff Size: Normal)   Pulse 71   Temp 99 F (37.2 C) (Oral)   Ht 5' (1.524 m)   Wt 126 lb 6 oz (57.3 kg)   SpO2 99%   BMI 24.68 kg/m  General:  well developed, well nourished, in no apparent distress Heart: RRR, no bruits, no LE edema Lungs: clear to auscultation, no accessory muscle use Psych: well oriented with normal range of affect and appropriate judgment/insight  Essential hypertension  Hyperlipidemia, unspecified hyperlipidemia type  Age-related osteoporosis without current pathological fracture  Chronic, stable. Cont Norvasc 2.5 mg/d. Counseled on diet and exercise. Chronic, stable. Cont Lipitor 40 mg/d.  Chronic, stable. Cont Fosamax 70 mg/d. Cont Ca/Vit D supp. Cont wt bearing exercise.  F/u in 6 mo. The patient voiced understanding and agreement to the plan.  Jilda Roche [place], DO 01/11/23  9:29 AM

## 2023-01-12 ENCOUNTER — Encounter: Payer: Self-pay | Admitting: Family Medicine

## 2023-01-13 ENCOUNTER — Other Ambulatory Visit: Payer: Self-pay | Admitting: Family Medicine

## 2023-02-04 ENCOUNTER — Other Ambulatory Visit: Payer: Self-pay | Admitting: Family Medicine

## 2023-02-13 ENCOUNTER — Other Ambulatory Visit: Payer: Self-pay | Admitting: Gastroenterology

## 2023-02-19 ENCOUNTER — Other Ambulatory Visit: Payer: Self-pay

## 2023-02-19 MED ORDER — ESOMEPRAZOLE MAGNESIUM 40 MG PO CPDR: DELAYED_RELEASE_CAPSULE | ORAL | 0 refills | Status: DC

## 2023-03-21 ENCOUNTER — Other Ambulatory Visit: Payer: Self-pay | Admitting: Family Medicine

## 2023-04-12 ENCOUNTER — Other Ambulatory Visit: Payer: Self-pay | Admitting: Family Medicine

## 2023-04-26 DIAGNOSIS — K080 Exfoliation of teeth due to systemic causes: Secondary | ICD-10-CM | POA: Diagnosis not present

## 2023-05-07 ENCOUNTER — Other Ambulatory Visit: Payer: Self-pay | Admitting: Family Medicine

## 2023-05-10 ENCOUNTER — Other Ambulatory Visit: Payer: Self-pay

## 2023-05-10 MED ORDER — ESOMEPRAZOLE MAGNESIUM 40 MG PO CPDR: DELAYED_RELEASE_CAPSULE | ORAL | 0 refills | Status: DC

## 2023-05-17 ENCOUNTER — Encounter: Payer: Self-pay | Admitting: Gastroenterology

## 2023-05-17 ENCOUNTER — Other Ambulatory Visit (INDEPENDENT_AMBULATORY_CARE_PROVIDER_SITE_OTHER): Payer: Medicare Other

## 2023-05-17 ENCOUNTER — Ambulatory Visit: Payer: Medicare Other | Admitting: Gastroenterology

## 2023-05-17 VITALS — BP 160/60 | HR 66 | Ht 60.0 in | Wt 126.0 lb

## 2023-05-17 DIAGNOSIS — K219 Gastro-esophageal reflux disease without esophagitis: Secondary | ICD-10-CM

## 2023-05-17 DIAGNOSIS — K449 Diaphragmatic hernia without obstruction or gangrene: Secondary | ICD-10-CM | POA: Diagnosis not present

## 2023-05-17 DIAGNOSIS — Z860100 Personal history of colon polyps, unspecified: Secondary | ICD-10-CM

## 2023-05-17 DIAGNOSIS — K2270 Barrett's esophagus without dysplasia: Secondary | ICD-10-CM

## 2023-05-17 LAB — BASIC METABOLIC PANEL
BUN: 13 mg/dL (ref 6–23)
CO2: 27 meq/L (ref 19–32)
Calcium: 8.8 mg/dL (ref 8.4–10.5)
Chloride: 103 meq/L (ref 96–112)
Creatinine, Ser: 0.67 mg/dL (ref 0.40–1.20)
GFR: 83.02 mL/min (ref >60.00)
Glucose, Bld: 96 mg/dL (ref 70–99)
Potassium: 3.9 meq/L (ref 3.5–5.1)
Sodium: 140 meq/L (ref 135–145)

## 2023-05-17 LAB — CBC
HCT: 32.8 % — ABNORMAL LOW (ref 36.0–46.0)
Hemoglobin: 10.9 g/dL — ABNORMAL LOW (ref 12.0–15.0)
MCHC: 33.4 g/dL (ref 30.0–36.0)
MCV: 91.5 fL (ref 78.0–100.0)
Platelets: 167 10*3/uL (ref 150.0–400.0)
RBC: 3.58 Mil/uL — ABNORMAL LOW (ref 3.87–5.11)
RDW: 13.7 % (ref 11.5–15.5)
WBC: 5.2 10*3/uL (ref 4.0–10.5)

## 2023-05-17 LAB — B12 AND FOLATE PANEL
Folate: 16.9 ng/mL (ref >5.9)
Vitamin B-12: 261 pg/mL (ref 211–911)

## 2023-05-17 LAB — VITAMIN D 25 HYDROXY (VIT D DEFICIENCY, FRACTURES): VITD: 47.88 ng/mL (ref 30.00–100.00)

## 2023-05-17 LAB — IBC + FERRITIN
Ferritin: 13.5 ng/mL (ref 10.0–291.0)
Iron: 73 ug/dL (ref 42–145)
Saturation Ratios: 23.5 % (ref 20.0–50.0)
TIBC: 310.8 ug/dL (ref 250.0–450.0)
Transferrin: 222 mg/dL (ref 212.0–360.0)

## 2023-05-17 MED ORDER — ESOMEPRAZOLE MAGNESIUM 40 MG PO CPDR: DELAYED_RELEASE_CAPSULE | ORAL | 3 refills | Status: DC

## 2023-05-17 NOTE — Patient Instructions (Addendum)
_______________________________________________________  If your blood pressure at your visit was 140/90 or greater, please contact your primary care physician to follow up on this.  _______________________________________________________  If you are age 79 or older, your body mass index should be between 23-30. Your Body mass index is 24.61 kg/m. If this is out of the aforementioned range listed, please consider follow up with your Primary Care Provider.  If you are age 35 or younger, your body mass index should be between 19-25. Your Body mass index is 24.61 kg/m. If this is out of the aformentioned range listed, please consider follow up with your Primary Care Provider.   ________________________________________________________  [facility] providers would like to encourage you to use [facility] to communicate with providers for non-urgent requests or questions.  Due to long hold times on the telephone, sending your provider a message by [facility] may be a faster and more efficient way to get a response.  Please allow 48 business hours for a response.  Please remember that this is for non-urgent requests.  _______________________________________________________  We have sent the following medications to your pharmacy for you to pick up at your convenience:  Nexium 40mg  one capsule daily.  Your provider has requested that you go to the basement level for lab work before leaving today. Press "B" on the elevator. The lab is located at the first door on the left as you exit the elevator.  Due to recent changes in healthcare laws, you may see the results of your imaging and laboratory studies on MyChart before your provider has had a chance to review them.  We understand that in some cases there may be results that are confusing or concerning to you. Not all laboratory results come back in the same time frame and the provider may be waiting for multiple results in order to interpret others.   Please give [place] 48 hours in order for your provider to thoroughly review all the results before contacting the office for clarification of your results.   You will need a follow up appointment in 2 years.  We will contact you to schedule this appointment.   It was a pleasure to see you today!  Vito Cirigliano, D.O.

## 2023-05-17 NOTE — Progress Notes (Signed)
Chief Complaint:    GERD, Barrett's Esophagus, medication refill  GI History: 77 y.o. female follows in the GI clinic for longstanding history of GERD and nondysplastic Barrett's Esophagus..  Previously followed with Dr. Ewing Schlein at [facility].  History of laparoscopic hiatal hernia repair in 01/2017.   Endoscopic History: - EGD 09/2004, 10/2007, 10/2010, 12/2013: No results available for review - EGD 09/2018: Barrett's Esophagus.  Recommended repeat in 3 years - Colonoscopy 09/2004, 02/2015, 05/2015: No report available for review - Colonoscopy 09/2018: Tubular adenoma.  Recommended repeat in 5 years - EGD (03/2020, Dr. Levora Angel; hospital admission for coffee-ground emesis): 2 superficial esophageal ulcers in the distal esophagus.  Large hiatal hernia with evidence of inflammation and contact bleeding.  Scattered mild antral gastritis, a few small gastric polyps.  Treated with high-dose PPI with recommendation to repeat upper endoscopy in 2 months.   GI imaging: - 09/04/2016: UGI: Large hiatal hernia containing vast majority of stomach with some organoaxial rotation.  Feline appearance in the distal esophagus can be associated with esophagitis. - 01/15/2017: Laparoscopic hiatal hernia repair and Nissen fundoplication - 01/16/2017: Barium esophagram: Large hiatal hernia has markedly improved after fundoplication.  Contrast fills the esophagus and easily traverses across the GE junction.  Minimal dilation above the GE junction which may represent small residual hiatal hernia. - 08/13/2017: Barium esophagram: Nissen fundoplication.  Esophagus is diffusely dilated with poor peristalsis.  GE junction remains above the diaphragm.  Mild to moderate residual hiatal hernia.  Focal narrowing of the stomach at the level of the diaphragm.  Barium tablet extends into hiatal hernia but did not pass through the diaphragm into the stomach.  This is likely due to redundancy of the hiatal hernia. - 03/14/2020: RUQ [place]: Heterogenous  liver suspicious for fatty infiltration or hepatocellular disease.  Normal GB, no duct dilation   - 01/20/2022: Establish GI care in this clinic.  Reflux being treated with Nexium 40 mg daily, with second dose 2-3 times per week for breakthrough.  Sleeps with HOB elevated.  Has had recurrence of reflux symptoms despite prior Nissen fundoplication in 2018.  Separately with episodic nausea, lightheadedness, diarrhea since 03/2021.  Starting probiotic in 04/2021 with some improvement. - 02/04/2022: EGD: Short segment nondysplastic Barrett's Esophagus (C1-M2), 4 cm hiatal hernia, intact fundoplication but located cephalad to diaphragmatic hiatus consistent with hernia recurrence.  Antral gastritis (erosions, erythema, shallow ulcers) with bxs demonstrating gastritis with areas of intestinal metaplasia.  Normal duodenum (path benign).  Increased Nexium to 40 mg bid x4 weeks consideration for surgical referral.  Repeat upper endoscopy in 3 years for surveillance and GIM mapping  HPI:     Patient is a 77 y.o. female presenting to the Gastroenterology Clinic for follow-up.  Was last seen by me in the GI clinic on 03/04/2022 as follow-up from EGD earlier that month.  Reflux was well-controlled at that time on Nexium 40 mg twice daily and had elected for continued high-dose PPI.  Today, she states she is doing well on current therapy.  She reduced Nexium to 40 mg daily, and reflux well-controlled and very rare  breakthrough symptoms.  Needs a refill on the Nexium.   Most recent labs reviewed.  Normal CMP and vitamin D in 07/2022.  No recent abdominal or chest imaging for review.  Does have a history of osteoporosis, follows with her PCM for this and is on Fosamax x 5 years.  Also takes daily calcium and vitamin D and maintains active lifestyle.   Review  of systems:     No chest pain, no SOB, no fevers, no urinary sx   Past Medical History:  Diagnosis Date   Barrett's esophagus    GERD (gastroesophageal reflux  disease)    History of adenomatous polyp of colon    Hypertension     Patient's surgical history, family medical history, social history, medications and allergies were all reviewed in Epic    Current Outpatient Medications  Medication Sig Dispense Refill   alendronate (FOSAMAX) 70 MG tablet Take 1 tablet (70 mg total) by mouth every 7 (seven) days. Take with a full glass of water on an empty stomach. 12 tablet 3   amLODipine (NORVASC) 2.5 MG tablet Take 1 tablet (2.5 mg total) by mouth daily. 90 tablet 1   atorvastatin (LIPITOR) 40 MG tablet Take 1 tablet by mouth once daily 90 tablet 0   esomeprazole (NEXIUM) 40 MG capsule TAKE 1 CAPSULE BY MOUTH ONCE DAILY TAKE  AN  ADDITIONAL CAPSULE AS NEEDED FOR BREAKTHROUGH REFLUX. Please call [phone] to schedule an office visit for more refills 30 capsule 0   losartan (COZAAR) 100 MG tablet Take 1 tablet by mouth once daily 90 tablet 0   polyvinyl alcohol (LIQUIFILM TEARS) 1.4 % ophthalmic solution Place 1 drop into both eyes as needed for dry eyes.     Probiotic Product (PROBIOTIC DAILY PO) Take 1 tablet by mouth daily.     rOPINIRole (REQUIP) 2 MG tablet TAKE 1 TABLET BY MOUTH AT BEDTIME 90 tablet 0   VITAMIN D PO Take 1 capsule by mouth daily.     zolpidem (AMBIEN) 5 MG tablet Take 0.5-1 tablets (2.5-5 mg total) by mouth at bedtime as needed for sleep. (Patient not taking: Reported on 05/17/2023) 10 tablet 0   No current facility-administered medications for this visit.    Physical Exam:     BP (!) 180/60   Pulse 66   Ht 5' (1.524 m)   Wt 126 lb (57.2 kg)   BMI 24.61 kg/m   GENERAL:  Pleasant female in NAD PSYCH: : Cooperative, normal affect CARDIAC:  RRR, no murmur heard, no peripheral edema PULM: Normal respiratory effort, lungs CTA bilaterally, no wheezing ABDOMEN:  Nondistended, soft, nontender. No obvious masses, no hepatomegaly,  normal bowel sounds SKIN:  turgor, no lesions seen Musculoskeletal:  Normal muscle tone,  normal strength NEURO: Alert and oriented x 3, no focal neurologic deficits   IMPRESSION and PLAN:    1) GERD 2) Nondysplastic, short segment Barrett's Esophagus 3) Hiatal hernia 4) History of Nissen fundoplication Reflux symptoms well controlled since increasing to high-dose PPI and has been able to decrease back down to daily dosing and still good control of reflux.  Most recent EGD with recurrence of hiatal hernia, but fundoplication wrap appears intact.  No erosive esophagitis.   - Continue Nexium 40 mg daily. New Rx placed today.  - Per previous conversation with Darel Hong, not interested in surgical repair of hernia - Continue antireflux lifestyle/dietary modifications - Repeat EGD in 2 years for Barrett's surveillance - Repeat BMP, CBC, and micronutrient check today given continued need of ongoing chronic PPI   5) Gastric intestinal metaplasia 6) Gastritis - Continue PPI as above - Repeat upper endoscopy with GIM mapping in 2 years   7) History of colon polyps - Due for repeat colonoscopy in 2025 for polyp surveillance.  We discussed the role and timing of repeat colonoscopy today.  Will tentatively plan for repeat colonoscopy in 2026 at the  time of repeat EGD, pending overall health and patient wishes.       Shellia Cleverly ,DO, FACG 05/17/2023, 8:41 AM

## 2023-06-14 ENCOUNTER — Other Ambulatory Visit: Payer: Self-pay | Admitting: Family Medicine

## 2023-06-14 DIAGNOSIS — H5203 Hypermetropia, bilateral: Secondary | ICD-10-CM | POA: Diagnosis not present

## 2023-07-14 ENCOUNTER — Encounter: Payer: Medicare Other | Admitting: Family Medicine

## 2023-07-21 ENCOUNTER — Ambulatory Visit (INDEPENDENT_AMBULATORY_CARE_PROVIDER_SITE_OTHER): Payer: Medicare Other | Admitting: Family Medicine

## 2023-07-21 ENCOUNTER — Encounter: Payer: Self-pay | Admitting: Family Medicine

## 2023-07-21 VITALS — BP 136/60 | HR 77 | Temp 97.8°F | Resp 16 | Ht 61.0 in | Wt 128.2 lb

## 2023-07-21 DIAGNOSIS — Z23 Encounter for immunization: Secondary | ICD-10-CM | POA: Diagnosis not present

## 2023-07-21 DIAGNOSIS — E2839 Other primary ovarian failure: Secondary | ICD-10-CM

## 2023-07-21 DIAGNOSIS — I10 Essential (primary) hypertension: Secondary | ICD-10-CM

## 2023-07-21 DIAGNOSIS — Z0000 Encounter for general adult medical examination without abnormal findings: Secondary | ICD-10-CM | POA: Diagnosis not present

## 2023-07-21 LAB — CBC
HCT: 36.1 % (ref 36.0–46.0)
Hemoglobin: 12 g/dL (ref 12.0–15.0)
MCHC: 33.2 g/dL (ref 30.0–36.0)
MCV: 91.4 fL (ref 78.0–100.0)
Platelets: 229 10*3/uL (ref 150.0–400.0)
RBC: 3.95 Mil/uL (ref 3.87–5.11)
RDW: 12.9 % (ref 11.5–15.5)
WBC: 5.2 10*3/uL (ref 4.0–10.5)

## 2023-07-21 LAB — COMPREHENSIVE METABOLIC PANEL
ALT: 16 U/L (ref 0–35)
AST: 19 U/L (ref 0–37)
Albumin: 4.3 g/dL (ref 3.5–5.2)
Alkaline Phosphatase: 68 U/L (ref 39–117)
BUN: 17 mg/dL (ref 6–23)
CO2: 28 meq/L (ref 19–32)
Calcium: 9.4 mg/dL (ref 8.4–10.5)
Chloride: 98 meq/L (ref 96–112)
Creatinine, Ser: 0.73 mg/dL (ref 0.40–1.20)
GFR: 78.02 mL/min (ref >60.00)
Glucose, Bld: 96 mg/dL (ref 70–99)
Potassium: 4.6 meq/L (ref 3.5–5.1)
Sodium: 136 meq/L (ref 135–145)
Total Bilirubin: 0.6 mg/dL (ref 0.2–1.2)
Total Protein: 6.8 g/dL (ref 6.0–8.3)

## 2023-07-21 LAB — VITAMIN D 25 HYDROXY (VIT D DEFICIENCY, FRACTURES): VITD: 46.58 ng/mL (ref 30.00–100.00)

## 2023-07-21 LAB — LIPID PANEL
Cholesterol: 250 mg/dL — ABNORMAL HIGH (ref 0–200)
HDL: 90.2 mg/dL (ref >39.00)
LDL Cholesterol: 147 mg/dL — ABNORMAL HIGH (ref 0–99)
NonHDL: 160.26
Total CHOL/HDL Ratio: 3
Triglycerides: 66 mg/dL (ref 0.0–149.0)
VLDL: 13.2 mg/dL (ref 0.0–40.0)

## 2023-07-21 MED ORDER — AMLODIPINE BESYLATE 5 MG PO TABS: 5.0000 mg | ORAL_TABLET | Freq: Every day | ORAL | 3 refills | Status: AC

## 2023-07-21 NOTE — Patient Instructions (Addendum)
 Give us  2-3 business days to get the results of your labs back.   Keep the diet clean and stay active.  Aim to do some physical exertion for 150 minutes per week. This is typically divided into 5 days per week, 30 minutes per day. The activity should be enough to get your heart rate up. Anything is better than nothing if you have time constraints.  Please get me a copy of your advanced directive form at your convenience.   Please consider getting your tetanus booster and final Shingrix vaccine at the pharmacy.   Consider a frog splint for your finger.  Foods that may reduce pain: 1) Ginger 2) Blueberries 3) Salmon 4) Pumpkin seeds 5) Dark chocolate 6) Turmeric 7) Tart cherries 8) Virgin olive oil 9) Chili peppers 10) Mint 11) Krill oil  Let us  know if you need anything.

## 2023-07-21 NOTE — Progress Notes (Signed)
 Chief Complaint  Patient presents with   Annual Exam    Annual Exam     Well Woman Sheila Rice is here for a complete physical.   Her last physical was >1 year ago.  Current diet: in general, a "healthy" diet. Current exercise: none lately; cycling and some strength training. Weight is stable and she denies daytime fatigue. Seatbelt? Yes Advanced directive? No  Health Maintenance Shingrix- due for 2nd DEXA- Due Tetanus- Due Pneumonia- Yes Hep C screen- Yes  Past Medical History:  Diagnosis Date   Barrett's esophagus    GERD (gastroesophageal reflux disease)    History of adenomatous polyp of colon    Hypertension      Past Surgical History:  Procedure Laterality Date   BACK SURGERY     Spinal Stenosis   BIOPSY  03/15/2020   Procedure: BIOPSY;  Surgeon: Felecia Hopper, MD;  Location: [facility];  Service: Gastroenterology;;   BUNIONECTOMY     COLONOSCOPY     ESOPHAGOGASTRODUODENOSCOPY (EGD) WITH PROPOFOL  N/A 03/15/2020   Procedure: ESOPHAGOGASTRODUODENOSCOPY (EGD) WITH PROPOFOL ;  Surgeon: Felecia Hopper, MD;  Location: [facility];  Service: Gastroenterology;  Laterality: N/A;   LAPAROSCOPIC NISSEN FUNDOPLICATION N/A 01/15/2017   Procedure: LAPAROSCOPIC NISSEN FUNDOPLICATION and HIATAL HERNIA REPAIR;  Surgeon: Adalberto Hollow, MD;  Location: [facility];  Service: General;  Laterality: N/A;   UPPER GASTROINTESTINAL ENDOSCOPY      Medications  Current Outpatient Medications on File Prior to Visit  Medication Sig Dispense Refill   atorvastatin  (LIPITOR) 40 MG tablet Take 1 tablet by mouth once daily 90 tablet 0   esomeprazole  (NEXIUM ) 40 MG capsule TAKE 1 CAPSULE BY MOUTH ONCE DAILY 90 capsule 3   losartan  (COZAAR ) 100 MG tablet Take 1 tablet by mouth once daily 90 tablet 0   polyvinyl alcohol (LIQUIFILM TEARS) 1.4 % ophthalmic solution Place 1 drop into both eyes as needed for dry eyes.     Probiotic Product (PROBIOTIC DAILY PO) Take 1 tablet by mouth daily.      rOPINIRole  (REQUIP ) 2 MG tablet TAKE 1 TABLET BY MOUTH AT BEDTIME 90 tablet 0   VITAMIN D  PO Take 1 capsule by mouth daily.     No current facility-administered medications on file prior to visit.     Allergies Allergies  Allergen Reactions   Ace Inhibitors Cough    Review of Systems: Constitutional:  no fevers Eye:  no recent significant change in vision Ears:  No changes in hearing Nose/Mouth/Throat:  no complaints of nasal congestion, no sore throat Cardiovascular: no chest pain Respiratory:  No shortness of breath Gastrointestinal:  No change in bowel habits GU:  Female: negative for dysuria Integumentary:  no abnormal skin lesions reported Neurologic:  no headaches Endocrine:  denies unexplained weight changes  Exam BP 136/60 (BP Location: Left Arm, Cuff Size: Normal)   Pulse 77   Temp 97.8 F (36.6 C) (Oral)   Resp 16   Ht 5\' 1"  (1.549 m)   Wt 128 lb 3.2 oz (58.2 kg)   SpO2 99%   BMI 24.22 kg/m  General:  well developed, well nourished, in no apparent distress Skin:  no significant moles, warts, or growths Head:  no masses, lesions, or tenderness Eyes:  pupils equal and round, sclera anicteric without injection Ears:  canals without lesions, TMs shiny without retraction, no obvious effusion, no erythema Nose:  nares patent, mucosa normal, and no drainage Throat/Pharynx:  lips and gingiva without lesion; tongue and uvula midline; non-inflamed pharynx;  no exudates or postnasal drainage Neck: neck supple without adenopathy, thyromegaly, or masses Lungs:  clear to auscultation, breath sounds equal bilaterally, no respiratory distress Cardio:  regular rate and rhythm, no bruits or LE edema Abdomen:  abdomen soft, nontender; bowel sounds normal; no masses or organomegaly Genital: Deferred Neuro:  gait normal; deep tendon reflexes normal and symmetric Psych: well oriented with normal range of affect and appropriate judgment/insight  Assessment and Plan  Well  adult exam  Estrogen deficiency - Plan: DG Bone Density, VITAMIN D  25 Hydroxy (Vit-D Deficiency, Fractures)  Essential hypertension - Plan: CBC, Comprehensive metabolic panel, Lipid panel  Need for influenza vaccination - Plan: Flu Vaccine Trivalent High Dose (Fluad)   Well 78 y.o. female. Counseled on diet and exercise. Shingrix and tetanus booster rec'd for pharmacy.  Please get me a copy of your advanced directive form at your convenience.  Flu shot today. Due for DEXA, off of Fosamax  currently.  Other orders as above. Follow up in 6 mo. The patient voiced understanding and agreement to the plan.  Shellie Dials Mer [place], DO 07/21/23 9:08 AM

## 2023-07-26 ENCOUNTER — Telehealth: Payer: Self-pay

## 2023-07-26 ENCOUNTER — Other Ambulatory Visit: Payer: Self-pay

## 2023-07-26 DIAGNOSIS — M810 Age-related osteoporosis without current pathological fracture: Secondary | ICD-10-CM

## 2023-07-26 NOTE — Telephone Encounter (Signed)
DG Bone Density  was order.

## 2023-07-27 ENCOUNTER — Encounter: Payer: Self-pay | Admitting: Family Medicine

## 2023-07-27 DIAGNOSIS — E7800 Pure hypercholesterolemia, unspecified: Secondary | ICD-10-CM

## 2023-07-27 NOTE — Telephone Encounter (Signed)
Called pt lab appt scheduled, labs ordered. Pt said she back medication.

## 2023-07-28 ENCOUNTER — Other Ambulatory Visit: Payer: Self-pay | Admitting: Family Medicine

## 2023-09-03 ENCOUNTER — Encounter: Payer: Self-pay | Admitting: Family Medicine

## 2023-09-03 ENCOUNTER — Other Ambulatory Visit (INDEPENDENT_AMBULATORY_CARE_PROVIDER_SITE_OTHER): Payer: Medicare Other

## 2023-09-03 DIAGNOSIS — E7800 Pure hypercholesterolemia, unspecified: Secondary | ICD-10-CM

## 2023-09-03 LAB — LIPID PANEL
Cholesterol: 190 mg/dL (ref 0–200)
HDL: 85.7 mg/dL (ref >39.00)
LDL Cholesterol: 93 mg/dL (ref 0–99)
NonHDL: 104.52
Total CHOL/HDL Ratio: 2
Triglycerides: 57 mg/dL (ref 0.0–149.0)
VLDL: 11.4 mg/dL (ref 0.0–40.0)

## 2023-09-06 ENCOUNTER — Other Ambulatory Visit: Payer: Self-pay | Admitting: Family Medicine

## 2023-09-06 ENCOUNTER — Other Ambulatory Visit: Payer: Medicare Other

## 2023-09-06 ENCOUNTER — Other Ambulatory Visit: Payer: Self-pay

## 2023-09-06 DIAGNOSIS — R7989 Other specified abnormal findings of blood chemistry: Secondary | ICD-10-CM

## 2023-09-06 DIAGNOSIS — D649 Anemia, unspecified: Secondary | ICD-10-CM

## 2023-09-07 ENCOUNTER — Other Ambulatory Visit (INDEPENDENT_AMBULATORY_CARE_PROVIDER_SITE_OTHER)

## 2023-09-07 DIAGNOSIS — R7989 Other specified abnormal findings of blood chemistry: Secondary | ICD-10-CM

## 2023-09-07 DIAGNOSIS — R29890 Loss of height: Secondary | ICD-10-CM | POA: Diagnosis not present

## 2023-09-07 DIAGNOSIS — D649 Anemia, unspecified: Secondary | ICD-10-CM

## 2023-09-07 DIAGNOSIS — M8588 Other specified disorders of bone density and structure, other site: Secondary | ICD-10-CM | POA: Diagnosis not present

## 2023-09-07 DIAGNOSIS — M810 Age-related osteoporosis without current pathological fracture: Secondary | ICD-10-CM | POA: Diagnosis not present

## 2023-09-07 LAB — VITAMIN B12: Vitamin B-12: 250 pg/mL (ref 211–911)

## 2023-09-07 LAB — CBC
HCT: 35.4 % — ABNORMAL LOW (ref 36.0–46.0)
Hemoglobin: 11.9 g/dL — ABNORMAL LOW (ref 12.0–15.0)
MCHC: 33.6 g/dL (ref 30.0–36.0)
MCV: 89.1 fl (ref 78.0–100.0)
Platelets: 213 10*3/uL (ref 150.0–400.0)
RBC: 3.97 Mil/uL (ref 3.87–5.11)
RDW: 12.7 % (ref 11.5–15.5)
WBC: 5.4 10*3/uL (ref 4.0–10.5)

## 2023-09-07 LAB — HM DEXA SCAN

## 2023-09-08 ENCOUNTER — Other Ambulatory Visit: Payer: Self-pay | Admitting: Family Medicine

## 2023-09-08 ENCOUNTER — Encounter: Payer: Self-pay | Admitting: Family Medicine

## 2023-09-08 ENCOUNTER — Other Ambulatory Visit: Payer: Self-pay

## 2023-09-08 DIAGNOSIS — M810 Age-related osteoporosis without current pathological fracture: Secondary | ICD-10-CM

## 2023-09-08 DIAGNOSIS — R7989 Other specified abnormal findings of blood chemistry: Secondary | ICD-10-CM

## 2023-09-08 MED ORDER — CYANOCOBALAMIN 1000 MCG/ML IJ SOLN
1000.0000 ug | INTRAMUSCULAR | Status: AC
Start: 2023-09-08 — End: 2023-10-06
  Administered 2023-09-16 – 2023-09-30 (×3): 1000 ug via INTRAMUSCULAR

## 2023-09-08 MED ORDER — CYANOCOBALAMIN 1000 MCG/ML IJ SOLN: INTRAMUSCULAR | 2 refills | Status: DC

## 2023-09-16 ENCOUNTER — Ambulatory Visit

## 2023-09-16 DIAGNOSIS — E538 Deficiency of other specified B group vitamins: Secondary | ICD-10-CM

## 2023-09-16 DIAGNOSIS — R7989 Other specified abnormal findings of blood chemistry: Secondary | ICD-10-CM

## 2023-09-22 DIAGNOSIS — K080 Exfoliation of teeth due to systemic causes: Secondary | ICD-10-CM | POA: Diagnosis not present

## 2023-09-23 ENCOUNTER — Ambulatory Visit (INDEPENDENT_AMBULATORY_CARE_PROVIDER_SITE_OTHER)

## 2023-09-23 DIAGNOSIS — E538 Deficiency of other specified B group vitamins: Secondary | ICD-10-CM

## 2023-09-23 DIAGNOSIS — R7989 Other specified abnormal findings of blood chemistry: Secondary | ICD-10-CM

## 2023-09-30 ENCOUNTER — Ambulatory Visit (INDEPENDENT_AMBULATORY_CARE_PROVIDER_SITE_OTHER)

## 2023-09-30 DIAGNOSIS — E538 Deficiency of other specified B group vitamins: Secondary | ICD-10-CM | POA: Diagnosis not present

## 2023-10-07 ENCOUNTER — Ambulatory Visit

## 2023-10-11 ENCOUNTER — Encounter: Payer: Self-pay | Admitting: Family Medicine

## 2023-10-11 DIAGNOSIS — M810 Age-related osteoporosis without current pathological fracture: Secondary | ICD-10-CM

## 2023-11-02 DIAGNOSIS — K080 Exfoliation of teeth due to systemic causes: Secondary | ICD-10-CM | POA: Diagnosis not present

## 2023-11-06 ENCOUNTER — Other Ambulatory Visit: Payer: Self-pay | Admitting: Family Medicine

## 2023-11-09 DIAGNOSIS — S81802A Unspecified open wound, left lower leg, initial encounter: Secondary | ICD-10-CM | POA: Diagnosis not present

## 2023-11-29 ENCOUNTER — Other Ambulatory Visit: Payer: Self-pay | Admitting: Family Medicine

## 2023-12-06 ENCOUNTER — Telehealth: Payer: Self-pay

## 2023-12-06 ENCOUNTER — Other Ambulatory Visit: Payer: Self-pay

## 2023-12-06 DIAGNOSIS — D649 Anemia, unspecified: Secondary | ICD-10-CM

## 2023-12-06 DIAGNOSIS — R7989 Other specified abnormal findings of blood chemistry: Secondary | ICD-10-CM

## 2023-12-06 NOTE — Telephone Encounter (Signed)
-----   Message from [facility] sent at 09/08/2023 12:58 PM EST ----- Re-check CBC and B12 level. Dr. Karene Oto patient.

## 2023-12-06 NOTE — Telephone Encounter (Signed)
 Patient informed to come in for repeat labs this week to recheck CBC and B12 level. Patient verbalized understanding and will come in tomorrow.

## 2023-12-14 ENCOUNTER — Other Ambulatory Visit

## 2023-12-14 DIAGNOSIS — D649 Anemia, unspecified: Secondary | ICD-10-CM | POA: Diagnosis not present

## 2023-12-14 DIAGNOSIS — R7989 Other specified abnormal findings of blood chemistry: Secondary | ICD-10-CM

## 2023-12-14 LAB — CBC WITH DIFFERENTIAL/PLATELET
Basophils Absolute: 0 10*3/uL (ref 0.0–0.1)
Basophils Relative: 0.3 % (ref 0.0–3.0)
Eosinophils Absolute: 0.2 10*3/uL (ref 0.0–0.7)
Eosinophils Relative: 3.2 % (ref 0.0–5.0)
HCT: 32.3 % — ABNORMAL LOW (ref 36.0–46.0)
Hemoglobin: 10.7 g/dL — ABNORMAL LOW (ref 12.0–15.0)
Lymphocytes Relative: 22.7 % (ref 12.0–46.0)
Lymphs Abs: 1.2 10*3/uL (ref 0.7–4.0)
MCHC: 33.1 g/dL (ref 30.0–36.0)
MCV: 88.4 fl (ref 78.0–100.0)
Monocytes Absolute: 0.5 10*3/uL (ref 0.1–1.0)
Monocytes Relative: 9.5 % (ref 3.0–12.0)
Neutro Abs: 3.4 10*3/uL (ref 1.4–7.7)
Neutrophils Relative %: 64.3 % (ref 43.0–77.0)
Platelets: 182 10*3/uL (ref 150.0–400.0)
RBC: 3.66 Mil/uL — ABNORMAL LOW (ref 3.87–5.11)
RDW: 13.5 % (ref 11.5–15.5)
WBC: 5.3 10*3/uL (ref 4.0–10.5)

## 2023-12-16 ENCOUNTER — Other Ambulatory Visit: Payer: Self-pay | Admitting: Family Medicine

## 2023-12-16 LAB — VITAMIN B12: Vitamin B-12: 701 pg/mL (ref 211–911)

## 2023-12-17 ENCOUNTER — Ambulatory Visit: Payer: Self-pay | Admitting: Gastroenterology

## 2024-01-10 ENCOUNTER — Other Ambulatory Visit: Payer: Self-pay

## 2024-01-10 DIAGNOSIS — D649 Anemia, unspecified: Secondary | ICD-10-CM

## 2024-01-18 ENCOUNTER — Encounter: Payer: Self-pay | Admitting: Family Medicine

## 2024-01-18 ENCOUNTER — Ambulatory Visit: Payer: Self-pay | Admitting: Family Medicine

## 2024-01-18 ENCOUNTER — Ambulatory Visit (INDEPENDENT_AMBULATORY_CARE_PROVIDER_SITE_OTHER): Payer: Medicare Other | Admitting: Family Medicine

## 2024-01-18 VITALS — BP 122/68 | HR 77 | Temp 98.0°F | Resp 16 | Ht 61.0 in | Wt 127.0 lb

## 2024-01-18 DIAGNOSIS — G2581 Restless legs syndrome: Secondary | ICD-10-CM

## 2024-01-18 DIAGNOSIS — I10 Essential (primary) hypertension: Secondary | ICD-10-CM

## 2024-01-18 DIAGNOSIS — E785 Hyperlipidemia, unspecified: Secondary | ICD-10-CM

## 2024-01-18 LAB — COMPREHENSIVE METABOLIC PANEL WITH GFR
ALT: 15 U/L (ref 0–35)
AST: 21 U/L (ref 0–37)
Albumin: 4.2 g/dL (ref 3.5–5.2)
Alkaline Phosphatase: 63 U/L (ref 39–117)
BUN: 8 mg/dL (ref 6–23)
CO2: 31 meq/L (ref 19–32)
Calcium: 9.3 mg/dL (ref 8.4–10.5)
Chloride: 101 meq/L (ref 96–112)
Creatinine, Ser: 0.76 mg/dL (ref 0.40–1.20)
GFR: 74.08 mL/min (ref >60.00)
Glucose, Bld: 97 mg/dL (ref 70–99)
Potassium: 4.5 meq/L (ref 3.5–5.1)
Sodium: 138 meq/L (ref 135–145)
Total Bilirubin: 0.6 mg/dL (ref 0.2–1.2)
Total Protein: 6.6 g/dL (ref 6.0–8.3)

## 2024-01-18 LAB — CBC
HCT: 33.7 % — ABNORMAL LOW (ref 36.0–46.0)
Hemoglobin: 11.2 g/dL — ABNORMAL LOW (ref 12.0–15.0)
MCHC: 33.3 g/dL (ref 30.0–36.0)
MCV: 88.5 fl (ref 78.0–100.0)
Platelets: 180 K/uL (ref 150.0–400.0)
RBC: 3.81 Mil/uL — ABNORMAL LOW (ref 3.87–5.11)
RDW: 13 % (ref 11.5–15.5)
WBC: 4.5 K/uL (ref 4.0–10.5)

## 2024-01-18 LAB — LIPID PANEL
Cholesterol: 175 mg/dL (ref 0–200)
HDL: 81.1 mg/dL (ref >39.00)
LDL Cholesterol: 81 mg/dL (ref 0–99)
NonHDL: 93.9
Total CHOL/HDL Ratio: 2
Triglycerides: 63 mg/dL (ref 0.0–149.0)
VLDL: 12.6 mg/dL (ref 0.0–40.0)

## 2024-01-18 MED ORDER — ROPINIROLE HCL 3 MG PO TABS: 3.0000 mg | ORAL_TABLET | Freq: Every day | ORAL | 2 refills | Status: AC

## 2024-01-18 NOTE — Progress Notes (Signed)
 Chief Complaint  Patient presents with   Follow-up    Follow Up     Subjective Sheila Rice is a 78 y.o. female who presents for hypertension follow up. She does not monitor home blood pressures. She is compliant with medications- losartan  100 mg/d, Norvasc  5 mg/d. Patient has these side effects of medication: none She is sometimes adhering to a healthy diet overall. Current exercise: cycling, strength training No CP or SOB.   Hyperlipidemia Patient presents for hyperlipidemia follow up. Currently being treated with Lipitor 40 mg/d and compliance with treatment thus far has been good. She denies myalgias. Diet/exercise as above.  The patient is not known to have coexisting coronary artery disease.  RLS History of RLS.  When she is on her feet a lot, her symptoms will start around 1 to 2 hours earlier than prior.  She is currently taking Requip  2 mg daily.   Past Medical History:  Diagnosis Date   Barrett's esophagus    GERD (gastroesophageal reflux disease)    History of adenomatous polyp of colon    Hypertension     Exam BP 122/68 (BP Location: Left Arm, Patient Position: Sitting)   Pulse 77   Temp 98 F (36.7 C) (Oral)   Resp 16   Ht 5' 1 (1.549 m)   Wt 127 lb (57.6 kg)   SpO2 99%   BMI 24.00 kg/m  General:  well developed, well nourished, in no apparent distress Heart: RRR, no bruits, no LE edema Lungs: clear to auscultation, no accessory muscle use Neuro: Gait nml.  Psych: well oriented with normal range of affect and appropriate judgment/insight  Essential hypertension - Plan: CBC, Comprehensive metabolic panel with GFR, Lipid panel  Hyperlipidemia, unspecified hyperlipidemia type  RLS (restless legs syndrome)  Chronic, stable.  Continue losartan  100 mg daily, Norvasc  5 mg daily.  We will add a CBC and forward results to Dr. San.  Counseled on diet and exercise. Chronic, stable.  Continue Lipitor 40 mg daily. Chronic, unstable.  Increase Requip   from 2 mg daily to 3 mg daily.  She will let me know how she does. F/u in 6 mo. The patient voiced understanding and agreement to the plan.  [name], DO 01/18/24  9:28 AM

## 2024-01-18 NOTE — Patient Instructions (Signed)
 Give [place] 2-3 business days to get the results of your labs back.   Keep the diet clean and stay active.  Let us know if you need anything.

## 2024-02-03 ENCOUNTER — Other Ambulatory Visit: Payer: Self-pay | Admitting: Family Medicine

## 2024-02-15 ENCOUNTER — Encounter: Payer: Self-pay | Admitting: Internal Medicine

## 2024-02-15 ENCOUNTER — Ambulatory Visit: Admitting: Internal Medicine

## 2024-02-15 VITALS — BP 120/80 | HR 76 | Ht 61.0 in | Wt 124.0 lb

## 2024-02-15 DIAGNOSIS — M810 Age-related osteoporosis without current pathological fracture: Secondary | ICD-10-CM | POA: Diagnosis not present

## 2024-02-15 MED ORDER — DENOSUMAB 60 MG/ML ~~LOC~~ SOSY
60.0000 mg | PREFILLED_SYRINGE | Freq: Once | SUBCUTANEOUS | Status: AC
Start: 2024-02-29 — End: ?

## 2024-02-15 NOTE — Progress Notes (Signed)
 Name: Sheila Rice  MRN/ DOB: [id number], [birth year]    Age/ Sex: 78 y.o., female    PCP: Frann Mabel Mt, DO   Reason for Endocrinology Evaluation: Osteoporosis     Date of Initial Endocrinology Evaluation: 02/15/2024     HPI: [name] is a 78 y.o. female with a past medical history of HTN, GERD, Barrett's esophagus, degenerative joint disease, history of alcohol use. The patient presented for initial endocrinology clinic visit on 02/15/2024 for consultative assistance with her osteoporosis.   Pt was diagnosed with osteoporosis: In 2019 with a T-score of -3.5 at the left distal radius  Menarche at age : 69 Menopausal at age : ~ age 66 Fracture Hx: Hx of superior and inferior pubic rami fracture, thoracic vertebrae wedge deformity Hx of HRT: yes- short period  FH of osteoporosis or hip fracture: Father  with osteoporosis  Prior Hx of anti-estrogenic therapy : no Prior Hx of anti-resorptive therapy : Alendronate  until 07/2023 Tobacco: no Alcohol: social 2-3x a week  Glucocorticoids : no Last fall was ~3 months  Has knee pains and hand arthritis  Calcium  citrate gummies 1000 mg daily  Vitamin D3 2000 international unit daily    Denies nausea or vomiting  Has hx of GERD resolved with nexium   No constipation or diarrhea     HISTORY:  Past Medical History:  Past Medical History:  Diagnosis Date   Barrett's esophagus    GERD (gastroesophageal reflux disease)    History of adenomatous polyp of colon    Hypertension    Past Surgical History:  Past Surgical History:  Procedure Laterality Date   BACK SURGERY     Spinal Stenosis   BIOPSY  03/15/2020   Procedure: BIOPSY;  Surgeon: Elicia Claw, MD;  Location: [facility];  Service: Gastroenterology;;   BUNIONECTOMY     COLONOSCOPY     ESOPHAGOGASTRODUODENOSCOPY (EGD) WITH PROPOFOL  N/A 03/15/2020   Procedure: ESOPHAGOGASTRODUODENOSCOPY (EGD) WITH PROPOFOL ;  Surgeon: Elicia Claw, MD;   Location: [facility];  Service: Gastroenterology;  Laterality: N/A;   LAPAROSCOPIC NISSEN FUNDOPLICATION N/A 01/15/2017   Procedure: LAPAROSCOPIC NISSEN FUNDOPLICATION and HIATAL HERNIA REPAIR;  Surgeon: Lily Boas, MD;  Location: [facility];  Service: General;  Laterality: N/A;   UPPER GASTROINTESTINAL ENDOSCOPY      Social History:  reports that she has never smoked. She has never used smokeless tobacco. She reports current alcohol use. She reports that she does not use drugs. Family History: family history includes Deep vein thrombosis in her mother; Heart disease in her father.   HOME MEDICATIONS: Allergies as of 02/15/2024       Reactions   Ace Inhibitors Cough        Medication List        Accurate as of February 15, 2024 10:57 AM. If you have any questions, ask your nurse or doctor.          amLODipine  5 MG tablet Commonly known as: NORVASC  Take 1 tablet (5 mg total) by mouth daily.   atorvastatin  40 MG tablet Commonly known as: LIPITOR Take 1 tablet (40 mg total) by mouth daily.   CALCIUM  CITRATE + D PO   esomeprazole  40 MG capsule Commonly known as: NEXIUM  TAKE 1 CAPSULE BY MOUTH ONCE DAILY   losartan  100 MG tablet Commonly known as: COZAAR  Take 1 tablet by mouth once daily   rOPINIRole  3 MG tablet Commonly known as: REQUIP  Take 1 tablet (3 mg total) by mouth at bedtime.  VITAMIN D  PO Take 1 capsule by mouth daily.          REVIEW OF SYSTEMS: A comprehensive ROS was conducted with the patient and is negative except as per HPI     OBJECTIVE:  VS: BP 120/80 (BP Location: Left Arm, Patient Position: Sitting, Cuff Size: Normal)   Pulse 76   Ht 5' 1 (1.549 m)   Wt 124 lb (56.2 kg)   SpO2 97%   BMI 23.43 kg/m    Wt Readings from Last 3 Encounters:  02/15/24 124 lb (56.2 kg)  01/18/24 127 lb (57.6 kg)  07/21/23 128 lb 3.2 oz (58.2 kg)     EXAM: General: Pt appears well and is in NAD  Neck:  Thyroid :  No goiter or nodules  appreciated.   Lungs: Clear with good BS bilat   Heart: Auscultation: RRR.  Abdomen: Soft, nontender  Extremities:  BL LE: No pretibial edema   Mental Status: Judgment, insight: Intact Orientation: Oriented to time, place, and person Mood and affect: No depression, anxiety, or agitation     DATA REVIEWED:  Latest Reference Range & Units 07/21/23 08:59  VITD 30.00 - 100.00 ng/mL 46.58      Latest Reference Range & Units 01/18/24 08:47  Sodium 135 - 145 mEq/L 138  Potassium 3.5 - 5.1 mEq/L 4.5  Chloride 96 - 112 mEq/L 101  CO2 19 - 32 mEq/L 31  Glucose 70 - 99 mg/dL 97  BUN 6 - 23 mg/dL 8  Creatinine 9.59 - 8.79 mg/dL 9.23  Calcium  8.4 - 10.5 mg/dL 9.3  Alkaline Phosphatase 39 - 117 U/L 63  Albumin 3.5 - 5.2 g/dL 4.2  AST 0 - 37 U/L 21  ALT 0 - 35 U/L 15  Total Protein 6.0 - 8.3 g/dL 6.6  Total Bilirubin 0.2 - 1.2 mg/dL 0.6  GFR >39.99 mL/min 74.08    DXA 09/07/2023   T-score Change from 2021  AP L1-L2 -2.6   RFN -3.2   RTH -2.0 Down 5.0%  LFN -2.3   LTH -2.0 Down 6.0%  Left 1/3 radius -3.2    Old records , labs and images have been reviewed.    ASSESSMENT/PLAN/RECOMMENDATIONS:   Osteoporosis  -Discussed pathophysiology of osteoporosis - She has been on alendronate  intermittently over the years, last intake was January, 2025 - Most recent DXA scan showed worsening of BMD at the hips - Today was discussed treatment options to include Evenity, PTH analogs, as well as Prolia  - Patient is concerned about the cost, but also prefers twice yearly injection - We will proceed with Prolia , discussed risk of hypocalcemia, and I have asked the patient to increase calcium  citrate - Encouraged weightbearing exercises    Medications : Increase calcium  citrate 1200-1500 mg daily Continue vitamin D3 2000 national units daily Start Prolia  60 mg subcutaneous every 6 months   Follow-up in 6 months  Signed electronically by: Stefano Redgie Butts, MD  [facility]  Endocrinology  [facility] [address] [place] [place], [place] [address] Phone: [phone] FAX: [phone]   CC: Frann Mabel Mt, DO [address] [place] [address] Phone: [phone] Fax: [phone]   Return to Endocrinology clinic as below: Future Appointments  Date Time Provider Department Center  07/21/2024  8:30 AM Frann, Mabel Mt, DO [id number]

## 2024-02-15 NOTE — Patient Instructions (Signed)
 Please increase calcium  citrate to 1200-1500 mg daily  Continue vitamin D  2000 international units daily Please incorporate weightbearing exercises    My office will contact you regarding Prolia  injections, and to schedule this once approved through your insurance

## 2024-02-19 ENCOUNTER — Telehealth: Payer: Self-pay

## 2024-02-19 NOTE — Telephone Encounter (Signed)
 ASSESSMENT/PLAN/RECOMMENDATIONS:    Osteoporosis   -Discussed pathophysiology of osteoporosis - She has been on alendronate  intermittently over the years, last intake was January, 2025 - Most recent DXA scan showed worsening of BMD at the hips - Today was discussed treatment options to include Evenity, PTH analogs, as well as Prolia  - Patient is concerned about the cost, but also prefers twice yearly injection - We will proceed with Prolia , discussed risk of hypocalcemia, and I have asked the patient to increase calcium  citrate - Encouraged weightbearing exercises       Medications : Increase calcium  citrate 1200-1500 mg daily Continue vitamin D3 2000 national units daily Start Prolia  60 mg subcutaneous every 6 months     Follow-up in 6 months

## 2024-02-22 NOTE — Telephone Encounter (Signed)
Prolia VOB initiated via [email or web address]  Next Prolia inj DUE: new start

## 2024-03-09 NOTE — Telephone Encounter (Signed)
 SABRA

## 2024-03-11 NOTE — Telephone Encounter (Signed)
 Medical Buy and Bill  Prior Authorization initiated for PROLIA  via [employer] / [employer]  RX Case#: [id number] Authorization Status: In Progress

## 2024-03-14 NOTE — Telephone Encounter (Signed)
 Medical [employer] and [employer]  Prior Authorization for PROLIA  APPROVED PA# [id number] Valid: 03/11/24-06/09/24

## 2024-03-15 ENCOUNTER — Other Ambulatory Visit: Payer: Self-pay | Admitting: Family Medicine

## 2024-03-24 NOTE — Telephone Encounter (Signed)
 Due to recent policy changes, VOB re-submitted.

## 2024-03-30 NOTE — Telephone Encounter (Signed)
 Confirming coverage for Prolia  with [employer], as Prior shara was approved but [employer] reports non-covered service.

## 2024-03-30 NOTE — Telephone Encounter (Signed)
 Per [employer] portal, not covered, consider changing to [employer].

## 2024-04-04 ENCOUNTER — Encounter: Payer: Self-pay | Admitting: Internal Medicine

## 2024-04-06 ENCOUNTER — Other Ambulatory Visit: Payer: Self-pay

## 2024-04-06 DIAGNOSIS — L821 Other seborrheic keratosis: Secondary | ICD-10-CM | POA: Diagnosis not present

## 2024-04-06 DIAGNOSIS — D485 Neoplasm of uncertain behavior of skin: Secondary | ICD-10-CM | POA: Diagnosis not present

## 2024-04-06 DIAGNOSIS — D1801 Hemangioma of skin and subcutaneous tissue: Secondary | ICD-10-CM | POA: Diagnosis not present

## 2024-04-06 DIAGNOSIS — L814 Other melanin hyperpigmentation: Secondary | ICD-10-CM | POA: Diagnosis not present

## 2024-04-06 NOTE — Telephone Encounter (Signed)
 Medical Buy and Bill  Prior Authorization on file for PROLIA 

## 2024-04-06 NOTE — Telephone Encounter (Signed)
 [facility]  Patient is ready for scheduling on or after 04/06/24  Out-of-pocket cost due at time of visit: $331.87  Primary: [employer] [place] [employer] Advant[employer] Essentials Plus HMO Prolia  co-insurance: 20% (approximately $331.87) Admin fee co-insurance: 20% (approximately $25)  Deductible: does not apply  Prior Auth: APPROVED PA# [id number] Valid: 03/11/24-06/09/24  Secondary: N/A Prolia  co-insurance:  Admin fee co-insurance:  Deductible:  Prior Auth:  PA# Valid:   ** This summary of benefits is an estimation of the patient's out-of-pocket cost. Exact cost may vary based on individual plan coverage.

## 2024-04-14 ENCOUNTER — Ambulatory Visit

## 2024-04-14 ENCOUNTER — Other Ambulatory Visit (INDEPENDENT_AMBULATORY_CARE_PROVIDER_SITE_OTHER)

## 2024-04-14 VITALS — BP 170/60 | HR 70 | Resp 20 | Ht 61.0 in | Wt 125.8 lb

## 2024-04-14 DIAGNOSIS — M810 Age-related osteoporosis without current pathological fracture: Secondary | ICD-10-CM

## 2024-04-14 DIAGNOSIS — D649 Anemia, unspecified: Secondary | ICD-10-CM

## 2024-04-14 LAB — CBC
HCT: 33.9 % — ABNORMAL LOW (ref 36.0–46.0)
Hemoglobin: 11.1 g/dL — ABNORMAL LOW (ref 12.0–15.0)
MCHC: 32.9 g/dL (ref 30.0–36.0)
MCV: 89.1 fl (ref 78.0–100.0)
Platelets: 186 K/uL (ref 150.0–400.0)
RBC: 3.8 Mil/uL — ABNORMAL LOW (ref 3.87–5.11)
RDW: 13.6 % (ref 11.5–15.5)
WBC: 5 K/uL (ref 4.0–10.5)

## 2024-04-14 MED ORDER — DENOSUMAB 60 MG/ML ~~LOC~~ SOSY: 60.0000 mg | PREFILLED_SYRINGE | Freq: Once | SUBCUTANEOUS | Status: AC

## 2024-04-14 MED ORDER — DENOSUMAB 60 MG/ML ~~LOC~~ SOSY
60.0000 mg | PREFILLED_SYRINGE | Freq: Once | SUBCUTANEOUS | Status: AC
  Administered 2024-04-14: 60 mg via SUBCUTANEOUS

## 2024-04-14 NOTE — Progress Notes (Signed)
 After obtaining consent, and per orders of Dr. Sam, injection of Prolia  given by Dietrich LOISE Lax. Patient instructed to remain in clinic for 20 minutes afterwards, and to report any adverse reaction to me immediately.   Patient BP elevated. Per patient she was nervous and not expected to pay the amount she paid. Patient is on BP meds. Patient advised to recheck BP when she arrives home. Patient to call provider if she feels symptomatic.

## 2024-04-17 ENCOUNTER — Ambulatory Visit: Payer: Self-pay | Admitting: Gastroenterology

## 2024-04-17 DIAGNOSIS — K080 Exfoliation of teeth due to systemic causes: Secondary | ICD-10-CM | POA: Diagnosis not present

## 2024-04-23 NOTE — Telephone Encounter (Signed)
 Last Prolia  inj 04/14/24 Next Prolia  inj due 10/14/24

## 2024-04-26 DIAGNOSIS — Z961 Presence of intraocular lens: Secondary | ICD-10-CM | POA: Diagnosis not present

## 2024-04-26 DIAGNOSIS — H40013 Open angle with borderline findings, low risk, bilateral: Secondary | ICD-10-CM | POA: Diagnosis not present

## 2024-04-27 DIAGNOSIS — D0439 Carcinoma in situ of skin of other parts of face: Secondary | ICD-10-CM | POA: Diagnosis not present

## 2024-05-08 ENCOUNTER — Other Ambulatory Visit: Payer: Self-pay | Admitting: Family Medicine

## 2024-05-12 ENCOUNTER — Ambulatory Visit

## 2024-05-12 DIAGNOSIS — Z23 Encounter for immunization: Secondary | ICD-10-CM

## 2024-05-12 NOTE — Progress Notes (Signed)
 Pt is here for Flu and LD  She tolerated it well.

## 2024-06-07 ENCOUNTER — Other Ambulatory Visit: Payer: Self-pay | Admitting: Gastroenterology

## 2024-07-19 ENCOUNTER — Other Ambulatory Visit (INDEPENDENT_AMBULATORY_CARE_PROVIDER_SITE_OTHER)

## 2024-07-19 ENCOUNTER — Encounter: Payer: Self-pay | Admitting: Gastroenterology

## 2024-07-19 ENCOUNTER — Ambulatory Visit: Admitting: Gastroenterology

## 2024-07-19 ENCOUNTER — Ambulatory Visit: Payer: Self-pay | Admitting: Gastroenterology

## 2024-07-19 VITALS — BP 120/70 | HR 73 | Ht 60.0 in | Wt 123.5 lb

## 2024-07-19 DIAGNOSIS — K2270 Barrett's esophagus without dysplasia: Secondary | ICD-10-CM

## 2024-07-19 DIAGNOSIS — K449 Diaphragmatic hernia without obstruction or gangrene: Secondary | ICD-10-CM

## 2024-07-19 DIAGNOSIS — Z860100 Personal history of colon polyps, unspecified: Secondary | ICD-10-CM | POA: Diagnosis not present

## 2024-07-19 DIAGNOSIS — K31A19 Gastric intestinal metaplasia without dysplasia, unspecified site: Secondary | ICD-10-CM | POA: Diagnosis not present

## 2024-07-19 DIAGNOSIS — R195 Other fecal abnormalities: Secondary | ICD-10-CM | POA: Diagnosis not present

## 2024-07-19 DIAGNOSIS — K219 Gastro-esophageal reflux disease without esophagitis: Secondary | ICD-10-CM | POA: Diagnosis not present

## 2024-07-19 DIAGNOSIS — R197 Diarrhea, unspecified: Secondary | ICD-10-CM

## 2024-07-19 LAB — BASIC METABOLIC PANEL WITH GFR
BUN: 16 mg/dL (ref 6–23)
CO2: 27 meq/L (ref 19–32)
Calcium: 8.9 mg/dL (ref 8.4–10.5)
Chloride: 93 meq/L — ABNORMAL LOW (ref 96–112)
Creatinine, Ser: 0.79 mg/dL (ref 0.40–1.20)
GFR: 70.47 mL/min
Glucose, Bld: 95 mg/dL (ref 70–99)
Potassium: 4.1 meq/L (ref 3.5–5.1)
Sodium: 128 meq/L — ABNORMAL LOW (ref 135–145)

## 2024-07-19 LAB — MAGNESIUM: Magnesium: 1.5 mg/dL (ref 1.5–2.5)

## 2024-07-19 LAB — C-REACTIVE PROTEIN: CRP: <0.5 mg/dL — ABNORMAL LOW (ref 1.0–20.0)

## 2024-07-19 NOTE — Patient Instructions (Addendum)
 _______________________________________________________  If your blood pressure at your visit was 140/90 or greater, please contact your primary care physician to follow up on this.  _______________________________________________________  If you are age 81 or older, your body mass index should be between 23-30. Your Body mass index is 24.12 kg/m. If this is out of the aforementioned range listed, please consider follow up with your Primary Care Provider.  If you are age 87 or younger, your body mass index should be between 19-25. Your Body mass index is 24.12 kg/m. If this is out of the aformentioned range listed, please consider follow up with your Primary Care Provider.   ________________________________________________________  The [facility] providers would like to encourage you to use MYCHART to communicate with providers for non-urgent requests or questions.  Due to long hold times on the telephone, sending your provider a message by [facility] may be a faster and more efficient way to get a response.  Please allow 48 business hours for a response.  Please remember that this is for non-urgent requests.  _______________________________________________________  [facility] is using a team-based approach to care.  Your team is made up of your doctor and two to three APPS. Our APPS (Nurse Practitioners and Physician Assistants) work with your physician to ensure care continuity for you. They are fully qualified to address your health concerns and develop a treatment plan. They communicate directly with your gastroenterologist to care for you. Seeing the Advanced Practice Practitioners on your physician's team can help you by facilitating care more promptly, often allowing for earlier appointments, access to diagnostic testing, procedures, and other specialty referrals.   Your provider has requested that you go to the basement level for lab work before leaving today. Press B on the  elevator. The lab is located at the first door on the left as you exit the elevator.  Your provider has ordered Diatherix stool testing for you. You have received a kit from our office today containing all necessary supplies to complete this test. Please carefully read the stool collection instructions provided in the kit before opening the accompanying materials. In addition, be sure there is a label providing your full name and date of birth on the puritan opti-swab tube that is supplied in the kit (if you do not see a label with this information on your test tube, please make us  aware before test collection!). After completing the test, you should secure the purtian tube into the specimen biohazard bag. The [facility] E-Req sheet (including date and time of specimen collection) should be placed into the outside pocket of the specimen biohazard bag and returned to the [place] lab (basement floor of [facility]) within 3 days of collection. Please make sure to give the specimen to a staff member at the lab. DO NOT leave the specimen on the counter.   If the specimen date and time (can be found in the upper right boxed portion of the sheet) are not filled out on the E-Req sheet, the test will NOT be performed.   Please purchase the following medications over the counter and take as directed: Fiber supplement (benefiber)  You have been scheduled for a colonoscopy. Please follow written instructions given to you at your visit today.   If you use inhalers (even only as needed), please bring them with you on the day of your procedure.  DO NOT TAKE 7 DAYS PRIOR TO TEST- Trulicity (dulaglutide) Ozempic, Wegovy (semaglutide) Mounjaro, Zepbound (tirzepatide) Bydureon Bcise (exanatide  extended release)  DO NOT TAKE 1 DAY PRIOR TO YOUR TEST Rybelsus (semaglutide) Adlyxin (lixisenatide) Victoza (liraglutide) Byetta  (exanatide) ___________________________________________________________________________  It was a pleasure to see you today!  Thank you for trusting me with your gastrointestinal care!

## 2024-07-19 NOTE — Addendum Note (Signed)
 Addended by: SAMMYE SANDERS, Daune Colgate on: 07/19/2024 10:17 AM   Modules accepted: Orders

## 2024-07-19 NOTE — Progress Notes (Signed)
 "  Chief Complaint:    Change in bowel habits  GI History: 79 y.o. female follows in the GI clinic for longstanding history of GERD and nondysplastic Barrett's Esophagus..  Previously followed with Dr. Rosalie at [facility].  History of laparoscopic hiatal hernia repair in 01/2017.   Endoscopic History: - EGD 09/2004, 10/2007, 10/2010, 12/2013: No results available for review - EGD 09/2018: Barrett's Esophagus.  Recommended repeat in 3 years - Colonoscopy 09/2004, 02/2015, 05/2015: No report available for review - Colonoscopy 09/2018: Tubular adenoma.  Recommended repeat in 5 years - EGD (03/2020, Dr. Elicia; hospital admission for coffee-ground emesis): 2 superficial esophageal ulcers in the distal esophagus.  Large hiatal hernia with evidence of inflammation and contact bleeding.  Scattered mild antral gastritis, a few small gastric polyps.  Treated with high-dose PPI with recommendation to repeat upper endoscopy in 2 months.   GI imaging: - 09/04/2016: UGI: Large hiatal hernia containing vast majority of stomach with some organoaxial rotation.  Feline appearance in the distal esophagus can be associated with esophagitis. - 01/15/2017: Laparoscopic hiatal hernia repair and Nissen fundoplication - 01/16/2017: Barium esophagram: Large hiatal hernia has markedly improved after fundoplication.  Contrast fills the esophagus and easily traverses across the GE junction.  Minimal dilation above the GE junction which may represent small residual hiatal hernia. - 08/13/2017: Barium esophagram: Nissen fundoplication.  Esophagus is diffusely dilated with poor peristalsis.  GE junction remains above the diaphragm.  Mild to moderate residual hiatal hernia.  Focal narrowing of the stomach at the level of the diaphragm.  Barium tablet extends into hiatal hernia but did not pass through the diaphragm into the stomach.  This is likely due to redundancy of the hiatal hernia. - 03/14/2020: RUQ US : Heterogenous liver suspicious for  fatty infiltration or hepatocellular disease.  Normal GB, no duct dilation   - 01/20/2022: Establish GI care in this clinic.  Reflux being treated with Nexium  40 mg daily, with second dose 2-3 times per week for breakthrough.  Sleeps with HOB elevated.  Has had recurrence of reflux symptoms despite prior Nissen fundoplication in 2018.  Separately with episodic nausea, lightheadedness, diarrhea since 03/2021.  Starting probiotic in 04/2021 with some improvement. - 02/04/2022: EGD: Short segment nondysplastic Barrett's Esophagus (C1-M2), 4 cm hiatal hernia, intact fundoplication but located cephalad to diaphragmatic hiatus consistent with hernia recurrence.  Antral gastritis (erosions, erythema, shallow ulcers) with bxs demonstrating gastritis with areas of intestinal metaplasia.  Normal duodenum (path benign).  Increased Nexium  to 40 mg bid x4 weeks consideration for surgical referral.  Repeat upper endoscopy in 3 years for surveillance and GIM mapping  HPI:     Patient is a 79 y.o. female presenting to the Gastroenterology Clinic for evaluation of change in bowel habits.  Has been having loose stools for the last year or so. No hematochezia, melena. Prior to this, was single formed stool every 2-3 days. Now stools are loose (not watery diarrhea), not formed.  1 BM daily.  No nocturnal stools, incontinence, seepage.  No associated abdominal pain, nausea/vomiting.   Patient is otherwise without preceding exposures to include recent antibiotics, hospitalization, sick contacts, travel and denies new medications, supplements, OTCs.   Has had leg cramps recently.  Has follow-up with PCP later this week.  Was otherwise last seen in GI clinic on 05/17/2023 for reflux and Barrett's Esophagus.  Reflux symptoms otherwise well-controlled with Nexium  40 mg daily.  Due for repeat upper endoscopy later this year for continued BE surveillance.  Latest Ref Rng & Units 04/14/2024   12:56 PM 01/18/2024    8:47 AM  12/14/2023   11:02 AM  CBC  WBC 4.0 - 10.5 K/uL 5.0  4.5  5.3   Hemoglobin 12.0 - 15.0 g/dL 88.8  88.7  89.2   Hematocrit 36.0 - 46.0 % 33.9  33.7  32.3   Platelets 150.0 - 400.0 K/uL 186.0  180.0  182.0        Latest Ref Rng & Units 01/18/2024    8:47 AM 07/21/2023    8:59 AM 05/17/2023    9:03 AM  CMP  Glucose 70 - 99 mg/dL 97  96  96   BUN 6 - 23 mg/dL 8  17  13    Creatinine 0.40 - 1.20 mg/dL 9.23  9.26  9.32   Sodium 135 - 145 mEq/L 138  136  140   Potassium 3.5 - 5.1 mEq/L 4.5  4.6  3.9   Chloride 96 - 112 mEq/L 101  98  103   CO2 19 - 32 mEq/L 31  28  27    Calcium  8.4 - 10.5 mg/dL 9.3  9.4  8.8   Total Protein 6.0 - 8.3 g/dL 6.6  6.8    Total Bilirubin 0.2 - 1.2 mg/dL 0.6  0.6    Alkaline Phos 39 - 117 U/L 63  68    AST 0 - 37 U/L 21  19    ALT 0 - 35 U/L 15  16       Review of systems:     No chest pain, no SOB, no fevers, no urinary sx   Past Medical History:  Diagnosis Date   Barrett's esophagus    GERD (gastroesophageal reflux disease)    History of adenomatous polyp of colon    Hypertension     Patient's surgical history, family medical history, social history, medications and allergies were all reviewed in Epic    Current Outpatient Medications  Medication Sig Dispense Refill   amLODipine  (NORVASC ) 5 MG tablet Take 1 tablet (5 mg total) by mouth daily. 90 tablet 3   atorvastatin  (LIPITOR) 40 MG tablet Take 1 tablet (40 mg total) by mouth daily. 90 tablet 1   Calcium  Citrate-Vitamin D  (CALCIUM  CITRATE + D PO)      esomeprazole  (NEXIUM ) 40 MG capsule Take 1 capsule by mouth once daily 90 capsule 3   losartan  (COZAAR ) 100 MG tablet Take 1 tablet (100 mg total) by mouth daily. 90 tablet 0   rOPINIRole  (REQUIP ) 3 MG tablet Take 1 tablet (3 mg total) by mouth at bedtime. 90 tablet 2   VITAMIN D  PO Take 1 capsule by mouth daily.     Current Facility-Administered Medications  Medication Dose Route Frequency Provider Last Rate Last Admin   denosumab  (PROLIA )  injection 60 mg  60 mg Subcutaneous Once Shamleffer, Ibtehal Jaralla, MD       [START ON 10/13/2024] denosumab  (PROLIA ) injection 60 mg  60 mg Subcutaneous Once Shamleffer, Ibtehal Jaralla, MD        Physical Exam:     Ht 5' (1.524 m)   Wt 123 lb 8 oz (56 kg)   BMI 24.12 kg/m   GENERAL:  Pleasant female in NAD PSYCH: : Cooperative, normal affect CARDIAC:  RRR, no murmur heard, no peripheral edema PULM: Normal respiratory effort, lungs CTA bilaterally, no wheezing ABDOMEN:  Nondistended, soft, nontender. No obvious masses, no hepatomegaly,  normal bowel sounds SKIN:  turgor, no lesions seen Musculoskeletal:  Normal muscle tone, normal strength NEURO: Alert and oriented x 3, no focal neurologic deficits   IMPRESSION and PLAN:    1) Change in bowel habits 2) Loose stools - Check fecal calprotectin, ESR, CRP, fecal pancreatic elastase - Infectious workup with GI PCR panel, including Giardia (well water) - Check BMP, magnesium  - Start fiber supplement such as Benefiber - Colonoscopy with random and directed biopsies  3) GERD 4) Nondysplastic, short segment Barrett's Esophagus 5) Hiatal hernia 6) History of Nissen fundoplication Reflux symptoms well-controlled on current therapy. Most recent EGD with recurrence of hiatal hernia, but fundoplication wrap appears intact.  No erosive esophagitis.   - Continue Nexium  40 mg daily - Per previous conversation with Dagoberto, not interested in surgical repair of hernia - Continue antireflux lifestyle/dietary modifications - Repeat EGD in 05/2025 for continued Barrett's Esophagus surveillance  7) Gastric intestinal metaplasia - Plan on GIM mapping at time of repeat upper endoscopy  8) History of colon polyps - Due for repeat colonoscopy for ongoing polyp surveillance.  Discussed role/utility of repeat colonoscopy with patient at length today and she would very much like to proceed with continued surveillance as she is otherwise overall  healthy and quite active - Scheduling colonoscopy as above  The indications, risks, and benefits of colonoscopy were explained to the patient in detail. Risks include but are not limited to bleeding, perforation, adverse reaction to medications, and cardiopulmonary compromise. Sequelae include but are not limited to the possibility of surgery, hospitalization, and mortality. The patient verbalized understanding and wished to proceed. All questions answered, referred to the scheduler and bowel prep ordered. Further recommendations pending results of the exam.            Sandor LULLA Flatter ,DO, FACG 07/19/2024, 9:27 AM  "

## 2024-07-20 LAB — SEDIMENTATION RATE: Sed Rate: 14 mm/h (ref 0–30)

## 2024-07-21 ENCOUNTER — Ambulatory Visit: Payer: Self-pay | Admitting: Family Medicine

## 2024-07-21 ENCOUNTER — Encounter: Payer: Self-pay | Admitting: Family Medicine

## 2024-07-21 ENCOUNTER — Ambulatory Visit: Admitting: Family Medicine

## 2024-07-21 VITALS — BP 136/70 | HR 63 | Temp 97.8°F | Resp 16 | Ht 61.0 in | Wt 123.6 lb

## 2024-07-21 DIAGNOSIS — I10 Essential (primary) hypertension: Secondary | ICD-10-CM

## 2024-07-21 DIAGNOSIS — E785 Hyperlipidemia, unspecified: Secondary | ICD-10-CM

## 2024-07-21 DIAGNOSIS — R252 Cramp and spasm: Secondary | ICD-10-CM

## 2024-07-21 LAB — LIPID PANEL
Cholesterol: 183 mg/dL (ref 28–200)
HDL: 82.3 mg/dL
LDL Cholesterol: 84 mg/dL (ref 10–99)
NonHDL: 100.82
Total CHOL/HDL Ratio: 2
Triglycerides: 85 mg/dL (ref 10.0–149.0)
VLDL: 17 mg/dL (ref 0.0–40.0)

## 2024-07-21 LAB — COMPREHENSIVE METABOLIC PANEL WITH GFR
ALT: 13 U/L (ref 3–35)
AST: 17 U/L (ref 5–37)
Albumin: 4.2 g/dL (ref 3.5–5.2)
Alkaline Phosphatase: 59 U/L (ref 39–117)
BUN: 14 mg/dL (ref 6–23)
CO2: 28 meq/L (ref 19–32)
Calcium: 9.2 mg/dL (ref 8.4–10.5)
Chloride: 98 meq/L (ref 96–112)
Creatinine, Ser: 0.73 mg/dL (ref 0.40–1.20)
GFR: 77.47 mL/min
Glucose, Bld: 94 mg/dL (ref 70–99)
Potassium: 4 meq/L (ref 3.5–5.1)
Sodium: 134 meq/L — ABNORMAL LOW (ref 135–145)
Total Bilirubin: 0.5 mg/dL (ref 0.2–1.2)
Total Protein: 6.9 g/dL (ref 6.0–8.3)

## 2024-07-21 LAB — MAGNESIUM: Magnesium: 1.7 mg/dL (ref 1.5–2.5)

## 2024-07-21 NOTE — Progress Notes (Signed)
 Chief Complaint  Patient presents with   Follow-up    Follow Up    Subjective Sheila Rice is a 79 y.o. female who presents for hypertension follow up. She does not monitor home blood pressures. She is compliant with medications-Norvasc  5 mg daily, losartan  100 mg daily. Patient has these side effects of medication: none She is adhering to a healthy diet overall. Current exercise: square dancing, walking, active around house No CP or SOB.   Hyperlipidemia Patient presents for hyperlipidemia follow up. Currently being treated with Lipitor 40 mg daily and compliance with treatment thus far has been good. She denies myalgias. Diet/exercise as above. The patient is not known to have coexisting coronary artery disease.   Patient has been dealing with cramping over the past week or so.  She saw GI who drew some labs.  She was not tolerating oral magnesium  and she was changed to the magnesium  glycinate.  Seems to be doing okay with this.  She was on antibiotic for a dental procedure for 5 days and wonders if this could be contributing.  Cramping is getting better overall.  Does not always stay well-hydrated.  Past Medical History:  Diagnosis Date   Barrett's esophagus    GERD (gastroesophageal reflux disease)    History of adenomatous polyp of colon    Hypertension     Exam BP 136/70 (BP Location: Left Arm, Patient Position: Sitting)   Pulse 63   Temp 97.8 F (36.6 C) (Oral)   Resp 16   Ht 5' 1 (1.549 m)   Wt 123 lb 9.6 oz (56.1 kg)   SpO2 96%   BMI 23.35 kg/m  General:  well developed, well nourished, in no apparent distress Heart: RRR, no bruits, no LE edema Lungs: clear to auscultation, no accessory muscle use Psych: well oriented with normal range of affect and appropriate judgment/insight  Essential hypertension  Hyperlipidemia, unspecified hyperlipidemia type - Plan: Comprehensive metabolic panel with GFR, Lipid panel  Leg cramping - Plan: Magnesium   Chronic,  stable.  Continue Norvasc  5 mg daily, losartan  100 mg daily.  Counseled on diet and exercise. Chronic, stable.  Continue Lipitor 40 mg daily. Follow-up on hypomagnesemia and hyponatremia.  Vitamin K recommended.  Stay hydrated.  Could be adverse effect of medication. F/u in 6 months. The patient voiced understanding and agreement to the plan.  Mabel [place], DO 07/21/24  8:42 AM

## 2024-07-21 NOTE — Patient Instructions (Addendum)
 Give us  2-3 business days to get the results of your labs back.   Keep the diet clean and stay active.  Please consider adding some weight resistance exercise to your routine. Consider yoga as well.   Consider 100-200 mcg daily of Vitamin K to help with cramping.   Try to drink 55-60 oz of water daily outside of exercise.  Look into getting your tetanus booster at the pharmacy.   Let us  know if you need anything.

## 2024-07-24 ENCOUNTER — Ambulatory Visit

## 2024-07-24 DIAGNOSIS — R195 Other fecal abnormalities: Secondary | ICD-10-CM

## 2024-07-24 DIAGNOSIS — R197 Diarrhea, unspecified: Secondary | ICD-10-CM

## 2024-07-25 LAB — GIARDIA ANTIGEN
MICRO NUMBER:: 17485689
RESULT:: NOT DETECTED
SPECIMEN QUALITY:: ADEQUATE

## 2024-07-25 NOTE — Progress Notes (Signed)
 Sheila Rice                                          MRN: [id number]   07/25/2024   The [facility] Quality Team Specialist reviewed this patient medical record for the purposes of chart review for care gap closure. The following were reviewed: abstraction for care gap closure-controlling blood pressure.    [facility]BCI Quality Team

## 2024-07-27 ENCOUNTER — Other Ambulatory Visit: Payer: Self-pay

## 2024-07-27 LAB — CALPROTECTIN, FECAL: Calprotectin, Fecal: 323 ug/g — ABNORMAL HIGH (ref 0–120)

## 2024-07-27 MED ORDER — NA SULFATE-K SULFATE-MG SULF 17.5-3.13-1.6 GM/177ML PO SOLN: 1.0000 | Freq: Once | ORAL | 0 refills | Status: AC

## 2024-07-28 LAB — PANCREATIC ELASTASE, FECAL: Pancreatic Elastase-1, Stool: >800 ug/g

## 2024-08-01 ENCOUNTER — Encounter: Payer: Self-pay | Admitting: Gastroenterology

## 2024-08-07 ENCOUNTER — Ambulatory Visit: Admitting: Internal Medicine

## 2024-08-09 ENCOUNTER — Ambulatory Visit: Admitting: Gastroenterology

## 2024-08-09 ENCOUNTER — Encounter: Payer: Self-pay | Admitting: Gastroenterology

## 2024-08-09 VITALS — BP 128/68 | HR 78 | Temp 97.0°F | Resp 12 | Ht 61.0 in | Wt 123.0 lb

## 2024-08-09 DIAGNOSIS — D125 Benign neoplasm of sigmoid colon: Secondary | ICD-10-CM

## 2024-08-09 DIAGNOSIS — D122 Benign neoplasm of ascending colon: Secondary | ICD-10-CM

## 2024-08-09 DIAGNOSIS — D120 Benign neoplasm of cecum: Secondary | ICD-10-CM

## 2024-08-09 DIAGNOSIS — R195 Other fecal abnormalities: Secondary | ICD-10-CM

## 2024-08-09 DIAGNOSIS — Z860100 Personal history of colon polyps, unspecified: Secondary | ICD-10-CM

## 2024-08-09 DIAGNOSIS — R197 Diarrhea, unspecified: Secondary | ICD-10-CM

## 2024-08-09 MED ORDER — SODIUM CHLORIDE 0.9 % IV SOLN: 500.0000 mL | INTRAVENOUS | Status: DC

## 2024-08-09 NOTE — Progress Notes (Signed)
 Vss nad trans to pacu

## 2024-08-09 NOTE — Patient Instructions (Signed)
 YOU HAD AN ENDOSCOPIC PROCEDURE TODAY AT THE [facility]:   Refer to the procedure report that was given to you for any specific questions about what was found during the examination.  If the procedure report does not answer your questions, please call your gastroenterologist to clarify.  If you requested that your care partner not be given the details of your procedure findings, then the procedure report has been included in a sealed envelope for you to review at your convenience later.  YOU SHOULD EXPECT: Some feelings of bloating in the abdomen. Passage of more gas than usual.  Walking can help get rid of the air that was put into your GI tract during the procedure and reduce the bloating. If you had a lower endoscopy (such as a colonoscopy or flexible sigmoidoscopy) you may notice spotting of blood in your stool or on the toilet paper. If you underwent a bowel prep for your procedure, you may not have a normal bowel movement for a few days.  Please Note:  You might notice some irritation and congestion in your nose or some drainage.  This is from the oxygen used during your procedure.  There is no need for concern and it should clear up in a day or so.  SYMPTOMS TO REPORT IMMEDIATELY:  Following lower endoscopy (colonoscopy or flexible sigmoidoscopy):  Excessive amounts of blood in the stool  Significant tenderness or worsening of abdominal pains  Swelling of the abdomen that is new, acute  Fever of 100F or higher  Resume previous diet Continue present medications Await pathology results Return to GI clinic as needed Use fiber, for example Citrucel, Fibercon, Konsyl or Metamucil Return to normal activities tomorrow Handout on polyps given   For urgent or emergent issues, a gastroenterologist can be reached at any hour by calling [phone]. Do not use MyChart messaging for urgent concerns.    DIET:  We do recommend a small meal at first, but then you may proceed to  your regular diet.  Drink plenty of fluids but you should avoid alcoholic beverages for 24 hours.  ACTIVITY:  You should plan to take it easy for the rest of today and you should NOT DRIVE or use heavy machinery until tomorrow (because of the sedation medicines used during the test).    FOLLOW UP: Our staff will call the number listed on your records the next business day following your procedure.  We will call around 7:15- 8:00 am to check on you and address any questions or concerns that you may have regarding the information given to you following your procedure. If we do not reach you, we will leave a message.     If any biopsies were taken you will be contacted by phone or by letter within the next 1-3 weeks.  Please call us  at [phone] if you have not heard about the biopsies in 3 weeks.    SIGNATURES/CONFIDENTIALITY: You and/or your care partner have signed paperwork which will be entered into your electronic medical record.  These signatures attest to the fact that that the information above on your After Visit Summary has been reviewed and is understood.  Full responsibility of the confidentiality of this discharge information lies with you and/or your care-partner.

## 2024-08-09 NOTE — Progress Notes (Signed)
 "   GASTROENTEROLOGY PROCEDURE H&P NOTE   Primary Care Physician: Frann Mabel Mt, DO    Reason for Procedure:   Change in bowel habits, loose stools, history of colon polyps  Plan:    Colonoscopy  Patient is appropriate for endoscopic procedure(s) in the ambulatory ([facility]) setting.  The nature of the procedure, as well as the risks, benefits, and alternatives were carefully and thoroughly reviewed with the patient. Ample time for discussion and questions allowed. The patient understood, was satisfied, and agreed to proceed. I personally addressed all patient questions and concerns.     HPI: Sheila Rice is a 79 y.o. female who presents for colonoscopy for evaluation of change in bowel habits with loose/watery, nonbloody stools, along with ongoing polyp surveillance.  Patient was most recently seen in the Gastroenterology Clinic on 07/19/2024 by me.  No interval change in medical history since that appointment. Please refer to that note for full details regarding GI history and clinical presentation.   Past Medical History:  Diagnosis Date   Barrett's esophagus    GERD (gastroesophageal reflux disease)    History of adenomatous polyp of colon    Hypertension     Past Surgical History:  Procedure Laterality Date   BACK SURGERY     Spinal Stenosis   BIOPSY  03/15/2020   Procedure: BIOPSY;  Surgeon: Elicia Claw, MD;  Location: [facility];  Service: Gastroenterology;;   BUNIONECTOMY     COLONOSCOPY     ESOPHAGOGASTRODUODENOSCOPY (EGD) WITH PROPOFOL  N/A 03/15/2020   Procedure: ESOPHAGOGASTRODUODENOSCOPY (EGD) WITH PROPOFOL ;  Surgeon: Elicia Claw, MD;  Location: [facility];  Service: Gastroenterology;  Laterality: N/A;   LAPAROSCOPIC NISSEN FUNDOPLICATION N/A 01/15/2017   Procedure: LAPAROSCOPIC NISSEN FUNDOPLICATION and HIATAL HERNIA REPAIR;  Surgeon: Lily Boas, MD;  Location: [facility];  Service: General;  Laterality: N/A;   UPPER GASTROINTESTINAL  ENDOSCOPY      Prior to Admission medications  Medication Sig Start Date End Date Taking? Authorizing Provider  amLODipine  (NORVASC ) 5 MG tablet Take 1 tablet (5 mg total) by mouth daily. 07/21/23  Yes Frann Mabel Mt, DO  atorvastatin  (LIPITOR) 40 MG tablet Take 1 tablet (40 mg total) by mouth daily. 03/15/24  Yes Frann Mabel Mt, DO  Calcium  Citrate-Vitamin D  (CALCIUM  CITRATE + D PO)  08/06/18  Yes [provider]  losartan  (COZAAR ) 100 MG tablet Take 1 tablet (100 mg total) by mouth daily. 05/08/24  Yes Frann Mabel Mt, DO  rOPINIRole  (REQUIP ) 3 MG tablet Take 1 tablet (3 mg total) by mouth at bedtime. 01/18/24  Yes Frann Mabel Mt, DO  VITAMIN D  PO Take 1 capsule by mouth daily.   Yes [provider]  esomeprazole  (NEXIUM ) 40 MG capsule Take 1 capsule by mouth once daily 06/07/24   Maddux Vanscyoc V, DO    Current Outpatient Medications  Medication Sig Dispense Refill   amLODipine  (NORVASC ) 5 MG tablet Take 1 tablet (5 mg total) by mouth daily. 90 tablet 3   atorvastatin  (LIPITOR) 40 MG tablet Take 1 tablet (40 mg total) by mouth daily. 90 tablet 1   Calcium  Citrate-Vitamin D  (CALCIUM  CITRATE + D PO)      losartan  (COZAAR ) 100 MG tablet Take 1 tablet (100 mg total) by mouth daily. 90 tablet 0   rOPINIRole  (REQUIP ) 3 MG tablet Take 1 tablet (3 mg total) by mouth at bedtime. 90 tablet 2   VITAMIN D  PO Take 1 capsule by mouth daily.     esomeprazole  (NEXIUM ) 40  MG capsule Take 1 capsule by mouth once daily 90 capsule 3   Current Facility-Administered Medications  Medication Dose Route Frequency Provider Last Rate Last Admin   0.9 %  sodium chloride  infusion  500 mL Intravenous Continuous Johnell Bas V, DO       denosumab  (PROLIA ) injection 60 mg  60 mg Subcutaneous Once Shamleffer, Ibtehal Jaralla, MD       [START ON 10/13/2024] denosumab  (PROLIA ) injection 60 mg  60 mg Subcutaneous Once Shamleffer, Ibtehal Jaralla, MD        Allergies  as of 08/09/2024 - Review Complete 08/09/2024  Allergen Reaction Noted   Ace inhibitors Cough 01/05/2017    Family History  Problem Relation Age of Onset   Deep vein thrombosis Mother    Heart disease Father    Colon cancer Neg Hx    Colon polyps Neg Hx    Esophageal cancer Neg Hx    Pancreatic cancer Neg Hx    Stomach cancer Neg Hx    Diabetes Neg Hx    Rectal cancer Neg Hx     Social History   Socioeconomic History   Marital status: Married    Spouse name: Not on file   Number of children: Not on file   Years of education: Not on file   Highest education level: Associate degree: academic program  Occupational History   Not on file  Tobacco Use   Smoking status: Never   Smokeless tobacco: Never  Vaping Use   Vaping status: Never Used  Substance and Sexual Activity   Alcohol use: Yes    Comment: Occa 2-3 glasses of wine a week   Drug use: No   Sexual activity: Not on file  Other Topics Concern   Not on file  Social History Narrative   Not on file   Social Drivers of Health   Tobacco Use: Low Risk (08/09/2024)   Patient History    Smoking Tobacco Use: Never    Smokeless Tobacco Use: Never    Passive Exposure: Not on file  Financial Resource Strain: Low Risk (07/18/2024)   Overall Financial Resource Strain (CARDIA)    Difficulty of Paying Living Expenses: Not very hard  Food Insecurity: No Food Insecurity (07/18/2024)   Epic    Worried About Programme Researcher, Broadcasting/film/video in the Last Year: Never true    Ran Out of Food in the Last Year: Never true  Transportation Needs: No Transportation Needs (07/18/2024)   Epic    Lack of Transportation (Medical): No    Lack of Transportation (Non-Medical): No  Physical Activity: Insufficiently Active (07/18/2024)   Exercise Vital Sign    Days of Exercise per Week: 2 days    Minutes of Exercise per Session: 20 min  Stress: No Stress Concern Present (07/18/2024)   [employer] of Occupational Health - Occupational Stress  Questionnaire    Feeling of Stress: Only a little  Social Connections: Socially Integrated (07/18/2024)   Social Connection and Isolation Panel    Frequency of Communication with Friends and Family: Three times a week    Frequency of Social Gatherings with Friends and Family: Once a week    Attends Religious Services: More than 4 times per year    Active Member of [employer] or Organizations: Yes    Attends Banker Meetings: More than 4 times per year    Marital Status: Married  Catering Manager Violence: Not At Risk (02/18/2022)   Humiliation, Afraid, Rape, and Kick questionnaire  Fear of Current or Ex-Partner: No    Emotionally Abused: No    Physically Abused: No    Sexually Abused: No  Depression (PHQ2-9): Low Risk (07/21/2024)   Depression (PHQ2-9)    PHQ-2 Score: 0  Alcohol Screen: Low Risk (07/18/2024)   Alcohol Screen    Last Alcohol Screening Score (AUDIT): 3  Housing: Low Risk (07/18/2024)   Epic    Unable to Pay for Housing in the Last Year: No    Number of Times Moved in the Last Year: 0    Homeless in the Last Year: No  Utilities: Not on file  Health Literacy: Not on file    Physical Exam: Vital signs in last 24 hours: @BP  (!) 159/69   Pulse 67   Temp (!) 97 F (36.1 C)   Ht 5' 1 (1.549 m)   Wt 123 lb (55.8 kg)   SpO2 100%   BMI 23.24 kg/m  GEN: NAD EYE: Sclerae anicteric ENT: MMM CV: Non-tachycardic Pulm: CTA b/l GI: Soft, NT/ND NEURO:  Alert & Oriented x 3   Sandor Flatter, DO [facility]   08/09/2024 7:59 AM  "

## 2024-08-09 NOTE — Progress Notes (Signed)
 Pt's states no medical or surgical changes since previsit or office visit.

## 2024-08-09 NOTE — Op Note (Signed)
 [facility] Patient Name: Sheila Rice Procedure Date: 08/09/2024 8:24 AM MRN: [id number] Endoscopist: Sandor Flatter , MD, [id number] Age: 79 Referring MD:  Date of Birth: September 25, 1945 Gender: Female Account #: [id number] Procedure:                Colonoscopy Indications:              Change in bowel habits, Diarrhea                           Additionally, she has a history of adenomatous                            polyps, with last colonoscopy at outside facility                            in 2020. Medicines:                Monitored Anesthesia Care Procedure:                Pre-Anesthesia Assessment:                           - Prior to the procedure, a History and Physical                            was performed, and patient medications and                            allergies were reviewed. The patient's tolerance of                            previous anesthesia was also reviewed. The risks                            and benefits of the procedure and the sedation                            options and risks were discussed with the patient.                            All questions were answered, and informed consent                            was obtained. Prior Anticoagulants: The patient has                            taken no anticoagulant or antiplatelet agents. ASA                            Grade Assessment: II - A patient with mild systemic                            disease. After reviewing the risks and benefits,  the patient was deemed in satisfactory condition to                            undergo the procedure.                           After obtaining informed consent, the colonoscope                            was passed under direct vision. Throughout the                            procedure, the patient's blood pressure, pulse, and                            oxygen saturations were monitored continuously. The                             Olympus Scope SN [id number] was introduced through the                            anus and advanced to the the terminal ileum. The                            colonoscopy was performed without difficulty. The                            patient tolerated the procedure well. The quality                            of the bowel preparation was good. The terminal                            ileum, ileocecal valve, appendiceal orifice, and                            rectum were photographed. Scope In: 8:43:26 AM Scope Out: 9:15:58 AM Scope Withdrawal Time: 0 hours 28 minutes 31 seconds  Total Procedure Duration: 0 hours 32 minutes 32 seconds  Findings:                 The perianal and digital rectal examinations were                            normal.                           A 6 mm polyp was found in the appendiceal orifice.                            The polyp was sessile. The polyp was removed with a                            cold snare. The tissue was then manipulated with  inversion of the appendix using cold forceps. The                            edges of the polypectomy were further avulsed using                            forceps for completion of polypectomy. Resection                            and retrieval were complete. Estimated blood loss                            was minimal.                           Two sessile polyps were found in the ascending                            colon and cecum. The polyps were 2 to 4 mm in size.                            These polyps were removed with a cold snare.                            Resection and retrieval were complete. Estimated                            blood loss was minimal.                           A 5 mm polyp was found in the sigmoid colon. The                            polyp was sessile. The polyp was removed with a                            cold snare. Resection and retrieval were complete.                             Estimated blood loss was minimal.                           A tattoo was seen in the transverse colon. A                            post-polypectomy scar was found at the tattoo site.                            No residual polypoid tissue noted using white light                            and narrowband imaging (NBI).  The mucosa throughout the remainder of the colon                            was otherwise normal-appearing. No areas of mucosal                            erythema, edema, erosions, or ulceration. Biopsies                            for histology were taken with a cold forceps from                            the right colon and left colon for evaluation of                            microscopic colitis. Estimated blood loss was                            minimal.                           The retroflexed view of the distal rectum and anal                            verge was normal and showed no anal or rectal                            abnormalities.                           The terminal ileum appeared normal. Complications:            No immediate complications. Estimated Blood Loss:     Estimated blood loss was minimal. Impression:               - One 6 mm polyp at the appendiceal orifice,                            removed with a combination of cold snare and cold                            forceps. Resected and retrieved.                           - Two 2 to 4 mm polyps in the ascending colon and                            in the cecum, removed with a cold snare. Resected                            and retrieved.                           - One 5 mm polyp in the sigmoid colon, removed with  a cold snare. Resected and retrieved.                           - A tattoo was seen in the transverse colon. A                            post-polypectomy scar was found at the tattoo site.                            - Normal mucosa in the entire examined colon.                            Biopsied.                           - The distal rectum and anal verge are normal on                            retroflexion view.                           - The examined portion of the ileum was normal. Recommendation:           - Patient has a contact number available for                            emergencies. The signs and symptoms of potential                            delayed complications were discussed with the                            patient. Return to normal activities tomorrow.                            Written discharge instructions were provided to the                            patient.                           - Resume previous diet.                           - Continue present medications.                           - Await pathology results.                           - Return to GI clinic PRN.                           - Use fiber, for example Citrucel, Fibercon, Konsyl  or Metamucil. Sandor Flatter, MD 08/09/2024 9:25:42 AM

## 2024-08-10 ENCOUNTER — Telehealth: Payer: Self-pay | Admitting: *Deleted

## 2024-08-10 NOTE — Telephone Encounter (Signed)
" °  Follow up Call-     08/09/2024    7:30 AM 02/04/2022    9:09 AM  Call back number  Post procedure Call Back phone  # [phone]  Permission to leave phone message Yes Yes     Patient questions:  Do you have a fever, pain , or abdominal swelling? No. Pain Score  0 *  Have you tolerated food without any problems? Yes.    Have you been able to return to your normal activities? Yes.    Do you have any questions about your discharge instructions: Diet   No. Medications  No. Follow up visit  No.  Do you have questions or concerns about your Care? No.  Actions: * If pain score is 4 or above: No action needed, pain <4.   "

## 2024-10-23 ENCOUNTER — Ambulatory Visit: Admitting: Internal Medicine

## 2025-01-16 ENCOUNTER — Encounter: Admitting: Family Medicine
